# Patient Record
Sex: Female | Born: 1955 | Race: Black or African American | Hispanic: No | Smoking: Never smoker
Health system: Southern US, Community
[De-identification: ages and names within clinical notes are randomized; demographics above are authoritative.]

## PROBLEM LIST (undated history)

## (undated) DIAGNOSIS — E785 Hyperlipidemia, unspecified: Secondary | ICD-10-CM

## (undated) DIAGNOSIS — Z8719 Personal history of other diseases of the digestive system: Secondary | ICD-10-CM

## (undated) DIAGNOSIS — D649 Anemia, unspecified: Secondary | ICD-10-CM

## (undated) DIAGNOSIS — R0600 Dyspnea, unspecified: Secondary | ICD-10-CM

## (undated) DIAGNOSIS — Z9889 Other specified postprocedural states: Secondary | ICD-10-CM

## (undated) DIAGNOSIS — I10 Essential (primary) hypertension: Secondary | ICD-10-CM

## (undated) DIAGNOSIS — Z9289 Personal history of other medical treatment: Secondary | ICD-10-CM

## (undated) DIAGNOSIS — R7303 Prediabetes: Secondary | ICD-10-CM

## (undated) DIAGNOSIS — Z8673 Personal history of transient ischemic attack (TIA), and cerebral infarction without residual deficits: Secondary | ICD-10-CM

## (undated) HISTORY — PX: DIAGNOSTIC LAPAROSCOPY: SUR761

## (undated) HISTORY — PX: LAPAROSCOPIC OOPHERECTOMY: SHX6507

## (undated) HISTORY — PX: ABDOMINAL HYSTERECTOMY: SHX81

## (undated) HISTORY — PX: ABDOMINAL SURGERY: SHX537

## (undated) NOTE — *Deleted (*Deleted)
Progress Note  Patient Name: Molly Flores Date of Encounter: 10/14/2020  [facility] Cardiologist: Armanda Magic, MD ***  Subjective   ***  Inpatient Medications    Scheduled Meds: . aspirin  81 mg Oral Daily  . atorvastatin  40 mg Oral Daily  . enoxaparin (LOVENOX) injection  60 mg Subcutaneous QHS  . metoprolol tartrate  50 mg Oral BID   Continuous Infusions:  PRN Meds: acetaminophen, ondansetron (ZOFRAN) IV   Vital Signs    Vitals:   10/14/20 1232 10/14/20 1303 10/14/20 1421 10/14/20 1653  BP:  (!) 171/85  (!) 158/84  Pulse:   66 78  Resp:   (!) 22 20  Temp: 98.2 F (36.8 C)   98.3 F (36.8 C)  TempSrc: Oral Oral  Oral  SpO2:   100% 99%  Weight:      Height:        Intake/Output Summary (Last 24 hours) at 10/14/2020 2013 Last data filed at 10/14/2020 0033 Gross per 24 hour  Intake 509.25 ml  Output -  Net 509.25 ml   Last 3 Weights 10/14/2020 04/12/2020 03/02/2020  Weight (lbs) 278 lb 10.6 oz 278 lb 9.6 oz 260 lb  Weight (kg) 126.4 kg 126.372 kg 117.935 kg      Telemetry    *** - Personally Reviewed  ECG    *** - Personally Reviewed  Physical Exam  *** GEN: No acute distress.   Neck: No JVD Cardiac: RRR, no murmurs, rubs, or gallops.  Respiratory: Clear to auscultation bilaterally. GI: Soft, nontender, non-distended  MS: No edema; No deformity. Neuro:  Nonfocal  Psych: Normal affect   Labs    High Sensitivity Troponin:   Recent Labs  Lab 10/13/20 2144 10/13/20 2350  TROPONINIHS 4 4      Chemistry Recent Labs  Lab 10/13/20 2144  NA 138  K 4.2  CL 103  CO2 25  GLUCOSE 146*  BUN 8  CREATININE 0.87  CALCIUM 9.5  PROT 7.4  ALBUMIN 3.6  AST 19  ALT 13  ALKPHOS 61  BILITOT 0.4  GFRNONAA >60  ANIONGAP 10     Hematology Recent Labs  Lab 10/13/20 2144  WBC 6.6  RBC 4.55  HGB 11.1*  HCT 38.0  MCV 83.5  MCH 24.4*  MCHC 29.2*  RDW 16.3*  PLT 281    BNP Recent Labs  Lab 10/13/20 2144  BNP 36.4      DDimer No results for input(s): DDIMER in the last 168 hours.   Radiology    CT ANGIO CHEST PE W OR WO CONTRAST  Result Date: 10/14/2020 CLINICAL DATA:  Syncope, fall, dyspnea, chest pressure EXAM: CT ANGIOGRAPHY CHEST WITH CONTRAST TECHNIQUE: Multidetector CT imaging of the chest was performed using the standard protocol during bolus administration of intravenous contrast. Multiplanar CT image reconstructions and MIPs were obtained to evaluate the vascular anatomy. CONTRAST:  80mL OMNIPAQUE IOHEXOL 350 MG/ML SOLN COMPARISON:  03/16/2020 FINDINGS: Cardiovascular: Satisfactory opacification of the pulmonary arteries to the segmental level. No evidence of pulmonary embolism. Normal heart size. No pericardial effusion. The thoracic aorta is unremarkable save for bovine arch anatomy. Mediastinum/Nodes: No enlarged mediastinal, hilar, or axillary lymph nodes. Thyroid gland, trachea, and esophagus demonstrate no significant findings. Lungs/Pleura: Evaluation of the pulmonary parenchyma is slightly limited by motion artifact. 7 mm noncalcified pulmonary nodule within the lingula may have enlarged slightly in the interval since prior examination, this may be in part artifactual and related to motion artifact. Intrapulmonary lymph  node along the minor fissure is not well visualized. Previously noted ground-glass pulmonary infiltrate is not well appreciated on the current examination. No new focal pulmonary infiltrates or nodules. No pneumothorax or pleural effusion. Central airways are widely patent. Upper Abdomen: No acute abnormality. Musculoskeletal: No acute bone abnormality Review of the MIP images confirms the above findings. IMPRESSION: No pulmonary embolism. No radiographic explanation for the patient's reported symptoms. Previously noted pulmonary nodules are not optimally assessed on this examination due to motion artifact. Follow-up evaluation, as outlined on prior report in 12 months from that examination  (approximately 03/2021) is recommended for definitive evaluation. Aortic Atherosclerosis (ICD10-I70.0). Electronically Signed   By: Helyn Numbers MD   On: 10/14/2020 00:25   DG Chest Port 1 View  Result Date: 10/13/2020 CLINICAL DATA:  89 year old female with shortness of breath. EXAM: PORTABLE CHEST 1 VIEW COMPARISON:  Chest CT dated 03/16/2020. FINDINGS: There is mild cardiomegaly with mild vascular congestion. No focal consolidation, pleural effusion or pneumothorax. No acute osseous pathology. IMPRESSION: Mild cardiomegaly with mild vascular congestion. No focal consolidation. Electronically Signed   By: Elgie Collard M.D.   On: 10/13/2020 22:13   ECHOCARDIOGRAM COMPLETE  Result Date: 10/14/2020    ECHOCARDIOGRAM REPORT   Patient Name:   Molly Flores Date of Exam: 10/14/2020 Medical Rec #:  [id number]    Height:       65.0 in Accession #:    [id number]   Weight:       278.7 lb Date of Birth:  14-May-1956    BSA:          2.278 m Patient Age:    64 years     BP:           164/77 mmHg Patient Gender: F            HR:           76 bpm. Exam Location:  Inpatient Procedure: 2D Echo, Cardiac Doppler and Color Doppler Indications:    R55 Syncope; R07.9* Chest pain, unspecified  History:        Patient has no prior history of Echocardiogram examinations.                 Risk Factors:Hypertension and Dyslipidemia.  Sonographer:    Elmarie Shiley Dance Referring Phys: [id number] JARED M GARDNER IMPRESSIONS  1. Left ventricular ejection fraction, by estimation, is 60 to 65%. The left ventricle has normal function. The left ventricle has no regional wall motion abnormalities. Left ventricular diastolic parameters were normal.  2. Right ventricular systolic function is normal. The right ventricular size is normal.  3. The mitral valve is normal in structure. No evidence of mitral valve regurgitation. No evidence of mitral stenosis.  4. The aortic valve is tricuspid. Aortic valve regurgitation is not visualized. No aortic  stenosis is present.  5. The inferior vena cava is normal in size with greater than 50% respiratory variability, suggesting right atrial pressure of 3 mmHg. FINDINGS  Left Ventricle: Left ventricular ejection fraction, by estimation, is 60 to 65%. The left ventricle has normal function. The left ventricle has no regional wall motion abnormalities. The left ventricular internal cavity size was normal in size. There is  no left ventricular hypertrophy. Left ventricular diastolic parameters were normal. Right Ventricle: The right ventricular size is normal.Right ventricular systolic function is normal. Left Atrium: Left atrial size was normal in size. Right Atrium: Right atrial size was normal in size. Pericardium: There is no evidence of pericardial  effusion. Mitral Valve: The mitral valve is normal in structure. No evidence of mitral valve regurgitation. No evidence of mitral valve stenosis. Tricuspid Valve: The tricuspid valve is normal in structure. Tricuspid valve regurgitation is trivial. No evidence of tricuspid stenosis. Aortic Valve: The aortic valve is tricuspid. Aortic valve regurgitation is not visualized. No aortic stenosis is present. Pulmonic Valve: The pulmonic valve was normal in structure. Pulmonic valve regurgitation is not visualized. No evidence of pulmonic stenosis. Aorta: The aortic root is normal in size and structure. Venous: The inferior vena cava is normal in size with greater than 50% respiratory variability, suggesting right atrial pressure of 3 mmHg.  LEFT VENTRICLE PLAX 2D LVIDd:         3.96 cm Diastology LVIDs:         3.05 cm LV e' medial:    8.05 cm/s LV PW:         1.03 cm LV E/e' medial:  11.8 LV IVS:        0.86 cm LV e' lateral:   10.80 cm/s                        LV E/e' lateral: 8.8  RIGHT VENTRICLE             IVC RV Basal diam:  3.16 cm     IVC diam: 1.77 cm RV Mid diam:    2.00 cm RV S prime:     12.20 cm/s TAPSE (M-mode): 2.0 cm LEFT ATRIUM             Index       RIGHT  ATRIUM           Index LA diam:        4.00 cm 1.76 cm/m  RA Area:     17.80 cm LA Vol (A2C):   48.6 ml 21.34 ml/m RA Volume:   54.00 ml  23.71 ml/m LA Vol (A4C):   57.0 ml 25.03 ml/m LA Biplane Vol: 53.4 ml 23.45 ml/m  AORTIC VALVE LVOT Vmax:   114.00 cm/s LVOT Vmean:  68.100 cm/s LVOT VTI:    0.244 m  AORTA Ao Asc diam: 3.00 cm MITRAL VALVE MV Area (PHT): 2.91 cm    SHUNTS MV Decel Time: 261 msec    Systemic VTI: 0.24 m MV E velocity: 94.60 cm/s MV A velocity: 80.50 cm/s MV E/A ratio:  1.18 Olga Millers MD Electronically signed by Olga Millers MD Signature Date/Time: 10/14/2020/10:43:36 AM    Final     Cardiac Studies   TTE 10/14/20: 1. Left ventricular ejection fraction, by estimation, is 60 to 65%. The  left ventricle has normal function. The left ventricle has no regional  wall motion abnormalities. Left ventricular diastolic parameters were  normal.  2. Right ventricular systolic function is normal. The right ventricular  size is normal.  3. The mitral valve is normal in structure. No evidence of mitral valve  regurgitation. No evidence of mitral stenosis.  4. The aortic valve is tricuspid. Aortic valve regurgitation is not  visualized. No aortic stenosis is present.  5. The inferior vena cava is normal in size with greater than 50%  respiratory variability, suggesting right atrial pressure of 3 mmHg.   Patient Profile     49 y.o. female with PMH  of hypertension, hyperlipidemia, prior stroke, pulmonary nodule and recent evaluation by pulmonology for ongoing shortness of breath since viral syndrome in February 2020 who is being seen today  for the evaluation of chest pain and syncope.  Assessment & Plan    #Syncope:  #Chest Pain:  #HTN  #HLD {Are we signing off today?:[id number]}  For questions or updates, please contact [facility] Please consult [email or web address] for contact info under        Signed, Meriam Sprague, MD  10/14/2020, 8:13 PM

---

## 2020-01-03 ENCOUNTER — Other Ambulatory Visit (HOSPITAL_COMMUNITY): Payer: Self-pay

## 2020-01-03 DIAGNOSIS — Z1231 Encounter for screening mammogram for malignant neoplasm of breast: Secondary | ICD-10-CM

## 2020-01-24 ENCOUNTER — Ambulatory Visit: Payer: Self-pay

## 2020-02-27 ENCOUNTER — Ambulatory Visit: Payer: Self-pay | Attending: Internal Medicine

## 2020-02-27 DIAGNOSIS — Z23 Encounter for immunization: Secondary | ICD-10-CM

## 2020-02-27 NOTE — Progress Notes (Signed)
   Covid-19 Vaccination Clinic  Name:  Meili Kleckley    MRN: [id number] DOB: 1956-06-12  02/27/2020  Ms. Deveny was observed post Covid-19 immunization for 15 minutes without incident. She was provided with Vaccine Information Sheet and instruction to access the V-Safe system.   Ms. Christon was instructed to call 911 with any severe reactions post vaccine: Marland Kitchen Difficulty breathing  . Swelling of face and throat  . A fast heartbeat  . A bad rash all over body  . Dizziness and weakness   Immunizations Administered    Name Date Dose VIS Date Route   Pfizer COVID-19 Vaccine 02/27/2020  8:13 AM 0.3 mL 11/11/2019 Intramuscular   Manufacturer: [employer], [employer]   Lot: [id number]   NDC: [address]-0

## 2020-03-02 ENCOUNTER — Other Ambulatory Visit: Payer: Self-pay

## 2020-03-02 ENCOUNTER — Ambulatory Visit (HOSPITAL_COMMUNITY)
Admission: EM | Admit: 2020-03-02 | Discharge: 2020-03-02 | Disposition: A | Discharge status: Home / Self Care | Payer: 59 | Attending: Family Medicine | Admitting: Family Medicine

## 2020-03-02 ENCOUNTER — Telehealth (HOSPITAL_COMMUNITY): Payer: Self-pay | Admitting: Family Medicine

## 2020-03-02 ENCOUNTER — Encounter (HOSPITAL_COMMUNITY): Payer: Self-pay

## 2020-03-02 DIAGNOSIS — R0600 Dyspnea, unspecified: Secondary | ICD-10-CM | POA: Diagnosis present

## 2020-03-02 DIAGNOSIS — R0609 Other forms of dyspnea: Secondary | ICD-10-CM

## 2020-03-02 HISTORY — DX: Essential (primary) hypertension: I10

## 2020-03-02 HISTORY — DX: Hyperlipidemia, unspecified: E78.5

## 2020-03-02 LAB — CBC
HCT: 36.5 % (ref 36.0–46.0)
Hemoglobin: 10.8 g/dL — ABNORMAL LOW (ref 12.0–15.0)
MCH: 24.6 pg — ABNORMAL LOW (ref 26.0–34.0)
MCHC: 29.6 g/dL — ABNORMAL LOW (ref 30.0–36.0)
MCV: 83.1 fL (ref 80.0–100.0)
Platelets: 269 10*3/uL (ref 150–400)
RBC: 4.39 MIL/uL (ref 3.87–5.11)
RDW: 16 % — ABNORMAL HIGH (ref 11.5–15.5)
WBC: 5.6 10*3/uL (ref 4.0–10.5)
nRBC: 0 % (ref 0.0–0.2)

## 2020-03-02 MED ORDER — PREDNISONE 20 MG PO TABS: 20.0000 mg | ORAL_TABLET | Freq: Two times a day (BID) | ORAL | 0 refills | Status: DC

## 2020-03-02 NOTE — ED Provider Notes (Signed)
[facility]    CSN: [id number] Arrival date & time: 03/02/20  1307      History   Chief Complaint Chief Complaint  Patient presents with  . Shortness of Breath    HPI Molly Flores is a 64 y.o. female.   HPI  Patient is here for dyspnea on exertion.  She states she chronically short of breath but is worse the last couple of days.  She states that has been going on ever since her hospitalization March 2020.  Patient states that she believes that she may have had Covid but was not tested for it.  She also complains of left chest pain and left arm pain with numbness and tingling in the left fingers. Patient's hospitalization of 02/18/2019 is reviewed.  She had an extensive work-up for chest pain, tingling, dyspnea.  She had CT of her chest.  Echocardiogram.  Cardiac stress test.  All of her testing was negative.  She followed up with a primary care doctor.  No clear reason was identified for her symptoms. Patient states she has asthma.  She states that her shortness of breath is improved with nebulizer treatments  she has not had any fever or chills.  No cough.  No change in taste or smell.  No known exposure to illness. She does have cardiac risk factors of hypertension, hyperlipidemia.  No diabetes.  She is obese and sedentary.  No family history of coronary disease.  Never smoker She also has a history of anemia.  Questions whether her anemia could cause shortness of breath.  States she takes iron on a regular basis.  States that when she last had blood work (couple months ago) her hemoglobin was low.  She does recall what the value was Past Medical History:  Diagnosis Date  . Hyperlipidemia   . Hypertension     There are no problems to display for this patient.   Past Surgical History:  Procedure Laterality Date  . ABDOMINAL SURGERY    . LAPAROSCOPIC OOPHERECTOMY Bilateral     OB History   No obstetric history on file.      Home Medications    Prior to  Admission medications   Medication Sig Start Date End Date Taking? Authorizing Provider  albuterol (VENTOLIN HFA) 108 (90 Base) MCG/ACT inhaler Inhale into the lungs. 12/09/18  Yes [provider]  atorvastatin (LIPITOR) 10 MG tablet Take by mouth. 08/30/19  Yes [provider]  carvedilol (COREG) 6.25 MG tablet Take by mouth. 08/30/19  Yes [provider]  Cholecalciferol 25 MCG (1000 UT) tablet Take by mouth. 09/15/16  Yes [provider]  esomeprazole (NEXIUM) 40 MG capsule Take by mouth. 02/24/19 03/02/20 Yes [provider]  aspirin 81 MG EC tablet Take by mouth.    [provider]  ferrous sulfate 325 (65 FE) MG tablet Take by mouth.    [provider]  ibuprofen (ADVIL) 200 MG tablet Take by mouth.    [provider]  loratadine (CLARITIN) 10 MG tablet Take by mouth.    [provider]  Multiple Vitamin (MULTI-VITAMIN) tablet Take by mouth.    [provider]  MULTIPLE VITAMIN PO Take by mouth.    [provider]  Omega-3 Fatty Acids (FISH OIL) 1000 MG CAPS Take by mouth.    [provider]  predniSONE (DELTASONE) 20 MG tablet Take 1 tablet (20 mg total) by mouth 2 (two) times daily with a meal. 03/02/20   Rica Mast  Collie Siad, MD    Family History Family History  Problem Relation Age of Onset  . Diabetes Mother   . Hyperlipidemia Mother   . Hypertension Mother     Social History Social History   Tobacco Use  . Smoking status: Never Smoker  . Smokeless tobacco: Never Used  Substance Use Topics  . Alcohol use: Never  . Drug use: Never     Allergies   Oxycodone   Review of Systems Review of Systems  Constitutional: Positive for fatigue. Negative for chills, diaphoresis and fever.  HENT: Negative for congestion and postnasal drip.   Eyes: Negative for redness and itching.  Respiratory: Positive for shortness of breath and wheezing. Negative for cough.   Cardiovascular:  Positive for chest pain.  Gastrointestinal: Negative for nausea and vomiting.  Neurological: Negative for headaches.     Physical Exam Triage Vital Signs ED Triage Vitals  Enc Vitals Group     BP 03/02/20 1339 (!) 175/98     Pulse Rate 03/02/20 1339 71     Resp 03/02/20 1339 18     Temp 03/02/20 1339 98.3 F (36.8 C)     Temp Source 03/02/20 1339 Oral     SpO2 03/02/20 1339 100 %     Weight 03/02/20 1340 260 lb (117.9 kg)     Height 03/02/20 1340 5\' 5"  (1.651 m)     Head Circumference --      Peak Flow --      Pain Score 03/02/20 1339 6     Pain Loc --      Pain Edu? --      Excl. in [place]? --    No data found.  Updated Vital Signs BP (!) 175/98   Pulse 71   Temp 98.3 F (36.8 C) (Oral)   Resp 18   Ht 5\' 5"  (1.651 m)   Wt 117.9 kg   SpO2 100%   BMI 43.27 kg/m      Physical Exam Constitutional:      General: She is not in acute distress.    Appearance: She is well-developed. She is obese.  HENT:     Head: Normocephalic and atraumatic.     Mouth/Throat:     Mouth: Mucous membranes are moist.     Comments: Mask is in place  Eyes:     Conjunctiva/sclera: Conjunctivae normal.     Pupils: Pupils are equal, round, and reactive to light.  Cardiovascular:     Rate and Rhythm: Normal rate and regular rhythm.     Heart sounds: Murmur present.     Comments: Soft systolic right upper sternal border  Pulmonary:     Effort: Pulmonary effort is normal. No respiratory distress.     Breath sounds: Normal breath sounds.     Comments: Lungs clear Abdominal:     General: There is no distension.     Palpations: Abdomen is soft.  Musculoskeletal:        General: Normal range of motion.     Cervical back: Normal range of motion and neck supple.     Right lower leg: No edema.     Left lower leg: No edema.  Skin:    General: Skin is warm and dry.  Neurological:     Mental Status: She is alert.  Psychiatric:        Mood and Affect: Mood normal.        Behavior: Behavior  normal.      UC Treatments /  Results  Labs (all labs ordered are listed, but only abnormal results are displayed) Labs Reviewed  CBC - Abnormal; Notable for the following components:      Result Value   Hemoglobin 10.8 (*)    MCH 24.6 (*)    MCHC 29.6 (*)    RDW 16.0 (*)    All other components within normal limits    EKG   Radiology No results found.  Procedures Procedures (including critical care time)  Medications Ordered in UC Medications - No data to display  Initial Impression / Assessment and Plan / UC Course  I have reviewed the triage vital signs and the nursing notes.  Pertinent labs & imaging results that were available during my care of the patient were reviewed by me and considered in my medical decision making (see chart for details).     Patient has asthma and has been wheezing by her report  This could explain the increased DOE The DOE for a year is not as easy to explain May be poor conditioning Heart testing all clear Never had PFT per patient Needs follow with PCP  Final Clinical Impressions(s) / UC Diagnoses   Final diagnoses:  Dyspnea on exertion     Discharge Instructions     Take the prednisone 2 x a day Continue the iron supplement You can get your test results on My Chart I will call later with the hemoglobin ( anemia) test See your primary care in follow up   ED Prescriptions    Medication Sig Dispense Auth. Provider   predniSONE (DELTASONE) 20 MG tablet Take 1 tablet (20 mg total) by mouth 2 (two) times daily with a meal. 10 tablet Eustace Moore, MD     PDMP not reviewed this encounter.   Eustace Moore, MD 03/02/20 2025

## 2020-03-02 NOTE — Discharge Instructions (Addendum)
Take the prednisone 2 x a day Continue the iron supplement You can get your test results on My Chart I will call later with the hemoglobin ( anemia) test See your primary care in follow up

## 2020-03-02 NOTE — Telephone Encounter (Signed)
Discussed CBC with patient Anemia is mild

## 2020-03-02 NOTE — ED Triage Notes (Addendum)
Pt c/o dyspnea w/exertion and at rest got worse the last couple of days, but this has been going since Feb or March of 2020. Pt states she believes she had COVID and went to hosp, but they wasn't testing for COVID at the time and she thinks she had COVID, because she was having all of the symptoms she says. Pt breath sound are clear. Pt has non labored breathing. Skin color WNL.  Pt also states has 7/10 non radiating mid to left chest tightness. Pt states last night she had numbness and tingling in left arm and fingers with the chest pain.

## 2020-03-14 ENCOUNTER — Other Ambulatory Visit: Payer: Self-pay | Admitting: Physician Assistant

## 2020-03-14 DIAGNOSIS — R911 Solitary pulmonary nodule: Secondary | ICD-10-CM

## 2020-03-16 ENCOUNTER — Ambulatory Visit
Admission: RE | Admit: 2020-03-16 | Discharge: 2020-03-16 | Disposition: A | Discharge status: Home / Self Care | Payer: 59 | Source: Ambulatory Visit | Attending: Physician Assistant | Admitting: Physician Assistant

## 2020-03-16 DIAGNOSIS — R911 Solitary pulmonary nodule: Secondary | ICD-10-CM

## 2020-03-21 ENCOUNTER — Ambulatory Visit: Payer: 59 | Attending: Internal Medicine

## 2020-03-21 DIAGNOSIS — Z23 Encounter for immunization: Secondary | ICD-10-CM

## 2020-03-21 NOTE — Progress Notes (Signed)
   Covid-19 Vaccination Clinic  Name:  Molly Flores    MRN: [id number] DOB: 1956-11-03  03/21/2020  [name] was observed post Covid-19 immunization for 15 minutes without incident. She was provided with Vaccine Information Sheet and instruction to access the V-Safe system.   [name] was instructed to call 911 with any severe reactions post vaccine: Marland Kitchen Difficulty breathing  . Swelling of face and throat  . A fast heartbeat  . A bad rash all over body  . Dizziness and weakness   Immunizations Administered    Name Date Dose VIS Date Route   Pfizer COVID-19 Vaccine 03/21/2020  8:51 AM 0.3 mL 01/25/2019 Intramuscular   Manufacturer: [employer], [employer]   Lot: [id number]   NDC: [address]-8

## 2020-04-12 ENCOUNTER — Encounter: Payer: Self-pay | Admitting: Pulmonary Disease

## 2020-04-12 ENCOUNTER — Ambulatory Visit (INDEPENDENT_AMBULATORY_CARE_PROVIDER_SITE_OTHER): Payer: 59 | Admitting: Pulmonary Disease

## 2020-04-12 ENCOUNTER — Other Ambulatory Visit: Payer: Self-pay

## 2020-04-12 ENCOUNTER — Other Ambulatory Visit (INDEPENDENT_AMBULATORY_CARE_PROVIDER_SITE_OTHER): Payer: 59

## 2020-04-12 VITALS — BP 136/70 | HR 72 | Temp 97.9°F | Ht 65.0 in | Wt 278.6 lb

## 2020-04-12 DIAGNOSIS — R911 Solitary pulmonary nodule: Secondary | ICD-10-CM

## 2020-04-12 DIAGNOSIS — R0689 Other abnormalities of breathing: Secondary | ICD-10-CM

## 2020-04-12 DIAGNOSIS — R0600 Dyspnea, unspecified: Secondary | ICD-10-CM | POA: Diagnosis not present

## 2020-04-12 LAB — CBC WITH DIFFERENTIAL/PLATELET
Basophils Absolute: 0 10*3/uL (ref 0.0–0.1)
Basophils Relative: 0.8 % (ref 0.0–3.0)
Eosinophils Absolute: 0 10*3/uL (ref 0.0–0.7)
Eosinophils Relative: 0.9 % (ref 0.0–5.0)
HCT: 35.1 % — ABNORMAL LOW (ref 36.0–46.0)
Hemoglobin: 11.1 g/dL — ABNORMAL LOW (ref 12.0–15.0)
Lymphocytes Relative: 33.4 % (ref 12.0–46.0)
Lymphs Abs: 1.6 10*3/uL (ref 0.7–4.0)
MCHC: 31.7 g/dL (ref 30.0–36.0)
MCV: 79.4 fl (ref 78.0–100.0)
Monocytes Absolute: 0.4 10*3/uL (ref 0.1–1.0)
Monocytes Relative: 8.4 % (ref 3.0–12.0)
Neutro Abs: 2.7 10*3/uL (ref 1.4–7.7)
Neutrophils Relative %: 56.5 % (ref 43.0–77.0)
Platelets: 278 10*3/uL (ref 150.0–400.0)
RBC: 4.42 Mil/uL (ref 3.87–5.11)
RDW: 16.1 % — ABNORMAL HIGH (ref 11.5–15.5)
WBC: 4.8 10*3/uL (ref 4.0–10.5)

## 2020-04-12 MED ORDER — BUDESONIDE-FORMOTEROL FUMARATE 160-4.5 MCG/ACT IN AERO: 2.0000 | INHALATION_SPRAY | Freq: Two times a day (BID) | RESPIRATORY_TRACT | 3 refills | Status: DC

## 2020-04-12 NOTE — Patient Instructions (Signed)
We will check CBC differential, IgE today Schedule pulmonary function test Trial Symbicort 160 inhaler  For your lung nodule please see if you can obtain the disc from the old CT scan. At this point the lung nodules are really small to be evaluated by PET scan or bronchoscopy. We will continue to follow this closely Follow-up CT without contrast in 6 months.

## 2020-04-12 NOTE — Addendum Note (Signed)
Addended by: Maurene Capes on: 04/12/2020 09:37 AM   Modules accepted: Orders

## 2020-04-12 NOTE — Progress Notes (Signed)
Molly Flores    [id number]    03-Oct-1956  Primary Care Physician:Quinn, Bertha Stakes, PA  Referring Physician: Ephriam Jenkins, [address] [place],  [place] [address]  Chief complaint:  Consult for wheezing, lung nodule  HPI: 64 year old with history of hypertension, hyperlipidemia, stroke, allergies She had a viral-like illness in February 2020 which she thinks is Covid.  Got tested at that time She had multiple emergency room visits while living at [facility] and a CTA done in March 2020 showed groundglass nodule in the right lung.  Since that episode she continues to have dyspnea at rest and with exertion associated with wheezing.  She just has Ventolin which she uses as needed several times a week. Seen in urgent care last month for dyspnea and wheezing and given 10 days of prednisone.  Pets: No pets Occupation: Works as a Surveyor, minerals and a daycare Exposures: No known exposures.  No mold, hot tub, Jacuzzi Smoking history: Never smoker.  Had been exposed to secondhand smoke in the past Travel history: Briefly lived in [place].  No significant recent travel Relevant family history: No significant family history of lung disease   Outpatient Encounter Medications as of 04/12/2020  Medication Sig  . albuterol (VENTOLIN HFA) 108 (90 Base) MCG/ACT inhaler Inhale into the lungs.  Marland Kitchen aspirin 81 MG EC tablet Take by mouth.  Marland Kitchen atorvastatin (LIPITOR) 10 MG tablet Take by mouth.  . carvedilol (COREG) 6.25 MG tablet Take by mouth.  . ferrous sulfate 325 (65 FE) MG tablet Take by mouth.  Marland Kitchen ibuprofen (ADVIL) 200 MG tablet Take by mouth.  . loratadine (CLARITIN) 10 MG tablet Take by mouth.  . Multiple Vitamin (MULTI-VITAMIN) tablet Take by mouth.  . Cholecalciferol 25 MCG (1000 UT) tablet Take by mouth.  . Omega-3 Fatty Acids (FISH OIL) 1000 MG CAPS Take by mouth.  . [DISCONTINUED] esomeprazole (NEXIUM) 40 MG capsule Take by mouth.  . [DISCONTINUED] MULTIPLE VITAMIN PO Take by mouth.   . [DISCONTINUED] predniSONE (DELTASONE) 20 MG tablet Take 1 tablet (20 mg total) by mouth 2 (two) times daily with a meal.   No facility-administered encounter medications on file as of 04/12/2020.    Allergies as of 04/12/2020 - Review Complete 04/12/2020  Allergen Reaction Noted  . Oxycodone  03/02/2020    Past Medical History:  Diagnosis Date  . Hyperlipidemia   . Hypertension     Past Surgical History:  Procedure Laterality Date  . ABDOMINAL SURGERY    . LAPAROSCOPIC OOPHERECTOMY Bilateral     Family History  Problem Relation Age of Onset  . Diabetes Mother   . Hyperlipidemia Mother   . Hypertension Mother     Social History   Socioeconomic History  . Marital status: Single    Spouse name: Not on file  . Number of children: Not on file  . Years of education: Not on file  . Highest education level: Not on file  Occupational History  . Not on file  Tobacco Use  . Smoking status: Never Smoker  . Smokeless tobacco: Never Used  Substance and Sexual Activity  . Alcohol use: Never  . Drug use: Never  . Sexual activity: Not on file  Other Topics Concern  . Not on file  Social History Narrative  . Not on file   Social Determinants of Health   Financial Resource Strain:   . Difficulty of Paying Living Expenses:   Food Insecurity:   .  Worried About Programme researcher, broadcasting/film/video in the Last Year:   . Barista in the Last Year:   Transportation Needs:   . Freight forwarder (Medical):   Marland Kitchen Lack of Transportation (Non-Medical):   Physical Activity:   . Days of Exercise per Week:   . Minutes of Exercise per Session:   Stress:   . Feeling of Stress :   Social Connections:   . Frequency of Communication with Friends and Family:   . Frequency of Social Gatherings with Friends and Family:   . Attends Religious Services:   . Active Member of Clubs or Organizations:   . Attends Banker Meetings:   Marland Kitchen Marital Status:   Intimate Partner Violence:    . Fear of Current or Ex-Partner:   . Emotionally Abused:   Marland Kitchen Physically Abused:   . Sexually Abused:     Review of systems: Review of Systems  Constitutional: Negative for fever and chills.  HENT: Negative.   Eyes: Negative for blurred vision.  Respiratory: as per HPI  Cardiovascular: Negative for chest pain and palpitations.  Gastrointestinal: Negative for vomiting, diarrhea, blood per rectum. Genitourinary: Negative for dysuria, urgency, frequency and hematuria.  Musculoskeletal: Negative for myalgias, back pain and joint pain.  Skin: Negative for itching and rash.  Neurological: Negative for dizziness, tremors, focal weakness, seizures and loss of consciousness.  Endo/Heme/Allergies: Negative for environmental allergies.  Psychiatric/Behavioral: Negative for depression, suicidal ideas and hallucinations.  All other systems reviewed and are negative.  Physical Exam: Blood pressure 136/70, pulse 72, temperature 97.9 F (36.6 C), temperature source Temporal, height 5\' 5"  (1.651 m), weight 278 lb 9.6 oz (126.4 kg), SpO2 99 %. Gen:      No acute distress HEENT:  EOMI, sclera anicteric Neck:     No masses; no thyromegaly Lungs:    Clear to auscultation bilaterally; normal respiratory effort CV:         Regular rate and rhythm; no murmurs Abd:      + bowel sounds; soft, non-tender; no palpable masses, no distension Ext:    No edema; adequate peripheral perfusion Skin:      Warm and dry; no rash Neuro: alert and oriented x 3 Psych: normal mood and affect  Data Reviewed: Imaging: CTA [facility] 12/09/2018-normal study  CTA [facility] 02/08/2019- 7 mm, noncalcified solitary pulmonary nodule in the anterior segment of the right upper lobe, unchanged in size and appearance when compared to the prior study.  CT chest 03/16/2020-6 mm nodule along the minor fissure, faint groundglass 10 mm nodule in the anterior segment of right lower lobe, 5 mm lingular nodule. I have reviewed the images  personally.  PFTs: Pending  Labs: CBC 03/02/2020-WBC 5.6, hemoglobin 10.8  Assessment:  Dyspnea, wheezing May have reactive airway disease, asthma We will evaluate with CBC differential, IgE Schedule pulmonary function test Trial her on Symbicort  Lung nodule I do not have the images from [facility] to review and will try to obtain the disc shipped to 05/02/2020.  By report she had right upper lobe nodule last year.  Her latest scan in March this year showed groundglass opacity in the right lower lobe with new 6 mm nodule in the fissure and 5 mm nodule in the lingula.  This does not appear to be growth of lung nodule as the lobes are different and the groundglass 10 mm nodule appears inflammatory in nature Follow this closely with a CT scan without contrast in 6 months.  Plan/Recommendations: CBC, IgE, PFTs Symbicort Follow-up CT in 6 months Obtain CT disc from [facility]  Chilton Greathouse MD [facility] 04/12/2020, 8:58 AM  CC: Adrienne Mocha, PA

## 2020-04-13 LAB — IGE: IgE (Immunoglobulin E), Serum: 79 kU/L (ref <=114)

## 2020-04-23 ENCOUNTER — Telehealth: Payer: Self-pay | Admitting: Pulmonary Disease

## 2020-04-23 NOTE — Telephone Encounter (Signed)
Called and spoke with pt. Pt wanted to know if we had received her records from [place], [place] yet as she stated she filled out a release of records form at her last visit on 5/13 with Dr. Isaiah Serge.  Pt also wanted to know the results of her tests she had done at that visit. Dr. Isaiah Serge, please advise on all this for pt. Thanks!

## 2020-04-26 NOTE — Telephone Encounter (Signed)
We have not received the CT disc from [place] yet.  Can you make another attempt to get the disc?

## 2020-04-27 NOTE — Telephone Encounter (Signed)
Attempted to call pt but unable to reach. Left message for her to return call. 

## 2020-05-01 NOTE — Telephone Encounter (Signed)
Called [facility] records to check on the status of the release sent on 04/12/20. The rep I spoke with stated that the CD was mailed on 04/13/20 from their [place] location. It was sent via [employer], therefore it does not a tracking number. She stated that sometimes it will take 2-4 weeks.   I checked both the inbox up front and Dr. Shirlee More inbox in [facility], did not see the CD.   Will continue to look out for CD.

## 2020-05-07 ENCOUNTER — Telehealth: Payer: Self-pay | Admitting: Pulmonary Disease

## 2020-05-07 NOTE — Telephone Encounter (Signed)
Called and spoke with Patient.  Patient requested lab results from 04/12/20.  Message routed to Dr Isaiah Serge to advise on lab results for Patient.

## 2020-05-08 NOTE — Telephone Encounter (Signed)
Waiting on result note from Dr. Isaiah Serge.

## 2020-05-14 ENCOUNTER — Telehealth: Payer: Self-pay | Admitting: Pulmonary Disease

## 2020-05-14 NOTE — Telephone Encounter (Signed)
Patient requesting a medication alternative. Please advise.  Patient calling back to ask about getting a replacement medication for Symbicort. Symbicort is $400 an patient cannot afford it. Please advise.  [phone]

## 2020-05-14 NOTE — Telephone Encounter (Signed)
Called and spoke with patient. She was made aware of her results. I did advise her that it appears we have not received the CT disk from [facility] and I would follow up on this as well.   She also had a question about her Symbicort. She was prescribed Symbicort 160 during her last OV but was advised it was over $400. She wanted to know if there was a generic. Advised her there is a generic and I would call [facility] to see if they can change it to the generic form. She verbalized understanding.   Spoke with the pharmacist who stated that the RX for 04/12/20 was processed as generic. She attempted to process the brand, it is not covered by insurance.   Covered medications include: Advair, AirDuo, Bevespi, Dulera and Pulmicort.   Dr. Isaiah Serge, please advise. Thanks!

## 2020-05-14 NOTE — Telephone Encounter (Signed)
Left message for patient to call back  

## 2020-05-14 NOTE — Telephone Encounter (Signed)
Pt called back about this, please return call.  

## 2020-05-14 NOTE — Telephone Encounter (Signed)
Labs are stable.

## 2020-05-15 MED ORDER — MOMETASONE FURO-FORMOTEROL FUM 200-5 MCG/ACT IN AERO: 2.0000 | INHALATION_SPRAY | Freq: Two times a day (BID) | RESPIRATORY_TRACT | 5 refills | Status: DC

## 2020-05-15 NOTE — Telephone Encounter (Signed)
Ask her to check with her insurance company or pharmacy about preferred inhalers

## 2020-05-15 NOTE — Telephone Encounter (Signed)
Prescribe Dulera 200

## 2020-05-15 NOTE — Telephone Encounter (Signed)
Rx has been sent in per Dr. Mannam. 

## 2020-05-15 NOTE — Telephone Encounter (Signed)
Spoke with the pt and notified of response per Dr Isaiah Serge  She will call her insurance company and get back to [place] with list of preferred inhalers  Will await her response

## 2020-05-21 ENCOUNTER — Other Ambulatory Visit (HOSPITAL_COMMUNITY): Payer: 59

## 2020-05-25 ENCOUNTER — Ambulatory Visit (INDEPENDENT_AMBULATORY_CARE_PROVIDER_SITE_OTHER): Payer: 59 | Admitting: Pulmonary Disease

## 2020-05-25 ENCOUNTER — Other Ambulatory Visit: Payer: Self-pay

## 2020-05-25 DIAGNOSIS — R911 Solitary pulmonary nodule: Secondary | ICD-10-CM

## 2020-05-25 LAB — PULMONARY FUNCTION TEST
DL/VA % pred: 166 %
DL/VA: 6.87 ml/min/mmHg/L
DLCO cor % pred: 106 %
DLCO cor: 22.83 ml/min/mmHg
DLCO unc % pred: 98 %
DLCO unc: 21.04 ml/min/mmHg
FEF 25-75 Post: 2.79 L/sec
FEF 25-75 Pre: 1.83 L/sec
FEF2575-%Change-Post: 52 %
FEF2575-%Pred-Post: 132 %
FEF2575-%Pred-Pre: 87 %
FEV1-%Change-Post: 9 %
FEV1-%Pred-Post: 79 %
FEV1-%Pred-Pre: 72 %
FEV1-Post: 1.75 L
FEV1-Pre: 1.59 L
FEV1FVC-%Change-Post: 6 %
FEV1FVC-%Pred-Pre: 108 %
FEV6-%Change-Post: 3 %
FEV6-%Pred-Post: 71 %
FEV6-%Pred-Pre: 68 %
FEV6-Post: 1.93 L
FEV6-Pre: 1.87 L
FEV6FVC-%Change-Post: 0 %
FEV6FVC-%Pred-Post: 103 %
FEV6FVC-%Pred-Pre: 103 %
FVC-%Change-Post: 3 %
FVC-%Pred-Post: 68 %
FVC-%Pred-Pre: 66 %
FVC-Post: 1.93 L
FVC-Pre: 1.87 L
Post FEV1/FVC ratio: 91 %
Post FEV6/FVC ratio: 100 %
Pre FEV1/FVC ratio: 85 %
Pre FEV6/FVC Ratio: 100 %
RV % pred: 86 %
RV: 1.86 L
TLC % pred: 74 %
TLC: 4.01 L

## 2020-05-25 NOTE — Progress Notes (Signed)
Full PFT preformed today  

## 2020-06-07 ENCOUNTER — Telehealth: Payer: Self-pay | Admitting: Pulmonary Disease

## 2020-06-07 NOTE — Telephone Encounter (Signed)
Patient calling for results of PFT. Patient advised that staff will call her back as soon as Dr. Isaiah Serge reviews the test. Patient understands he is out of the office until 06/10/2020.

## 2020-06-12 ENCOUNTER — Telehealth: Payer: Self-pay | Admitting: Pulmonary Disease

## 2020-06-12 NOTE — Telephone Encounter (Signed)
I tried to call the patient but got voicemail Please let her know that PFTs show very minimal reduction in lung volumes that is likely nonsignificant We will discuss in detail at time of clinic visit.

## 2020-06-12 NOTE — Telephone Encounter (Signed)
Patient contacted with results of PFT.

## 2020-06-12 NOTE — Telephone Encounter (Signed)
Patient contacted with results of PFT. 

## 2020-06-12 NOTE — Telephone Encounter (Signed)
Patient called with results of PFT, she verbalized understanding of results.

## 2020-07-12 ENCOUNTER — Other Ambulatory Visit: Payer: 59

## 2020-07-12 ENCOUNTER — Other Ambulatory Visit: Payer: Self-pay | Admitting: Radiology

## 2020-07-12 DIAGNOSIS — Z20822 Contact with and (suspected) exposure to covid-19: Secondary | ICD-10-CM

## 2020-07-14 LAB — SARS-COV-2, NAA 2 DAY TAT

## 2020-07-14 LAB — NOVEL CORONAVIRUS, NAA: SARS-CoV-2, NAA: NOT DETECTED

## 2020-10-11 ENCOUNTER — Telehealth: Payer: Self-pay | Admitting: Pulmonary Disease

## 2020-10-11 NOTE — Telephone Encounter (Signed)
Yes. Ok to do televisit

## 2020-10-11 NOTE — Telephone Encounter (Signed)
Called and spoke with patient, she stated that she was originally told that her appointment was on 11/22 at 3:30 pm and she gave that information to her boss at work.  She was then told it was at 8:30 am.  She is taking off work to do a scan and that time was changed as well.  She is asking if she can do a televisit for that appointment.  I advised her I would let Dr. Isaiah Serge know and when we heard back from him we would call her back and let her know.  She verbalized understanding.  Dr. Isaiah Serge, Please advise if it is ok to change her appointment to a televisit for 10/22/20.  Thank you.

## 2020-10-12 NOTE — Telephone Encounter (Signed)
Patient is aware of below message.  Appointment note has been updated to reflect phone visit.  Nothing further needed.

## 2020-10-13 ENCOUNTER — Observation Stay (HOSPITAL_COMMUNITY)
Admission: EM | Admit: 2020-10-13 | Discharge: 2020-10-15 | Disposition: A | Discharge status: Home / Self Care | Payer: 59 | Attending: Internal Medicine | Admitting: Internal Medicine

## 2020-10-13 ENCOUNTER — Other Ambulatory Visit: Payer: Self-pay

## 2020-10-13 ENCOUNTER — Emergency Department (HOSPITAL_COMMUNITY): Payer: 59

## 2020-10-13 DIAGNOSIS — R55 Syncope and collapse: Principal | ICD-10-CM | POA: Insufficient documentation

## 2020-10-13 DIAGNOSIS — R918 Other nonspecific abnormal finding of lung field: Secondary | ICD-10-CM | POA: Diagnosis present

## 2020-10-13 DIAGNOSIS — R079 Chest pain, unspecified: Secondary | ICD-10-CM | POA: Diagnosis not present

## 2020-10-13 DIAGNOSIS — R0600 Dyspnea, unspecified: Secondary | ICD-10-CM | POA: Diagnosis present

## 2020-10-13 DIAGNOSIS — R0609 Other forms of dyspnea: Secondary | ICD-10-CM | POA: Diagnosis present

## 2020-10-13 DIAGNOSIS — Z79899 Other long term (current) drug therapy: Secondary | ICD-10-CM | POA: Diagnosis not present

## 2020-10-13 DIAGNOSIS — I10 Essential (primary) hypertension: Secondary | ICD-10-CM | POA: Insufficient documentation

## 2020-10-13 DIAGNOSIS — R0602 Shortness of breath: Secondary | ICD-10-CM | POA: Diagnosis not present

## 2020-10-13 DIAGNOSIS — Z20822 Contact with and (suspected) exposure to covid-19: Secondary | ICD-10-CM | POA: Insufficient documentation

## 2020-10-13 HISTORY — DX: Dyspnea, unspecified: R06.00

## 2020-10-13 HISTORY — DX: Other specified postprocedural states: Z98.890

## 2020-10-13 HISTORY — DX: Anemia, unspecified: D64.9

## 2020-10-13 HISTORY — DX: Personal history of other medical treatment: Z92.89

## 2020-10-13 HISTORY — DX: Prediabetes: R73.03

## 2020-10-13 HISTORY — DX: Personal history of transient ischemic attack (TIA), and cerebral infarction without residual deficits: Z86.73

## 2020-10-13 HISTORY — DX: Personal history of other diseases of the digestive system: Z87.19

## 2020-10-13 LAB — CBC
HCT: 38 % (ref 36.0–46.0)
Hemoglobin: 11.1 g/dL — ABNORMAL LOW (ref 12.0–15.0)
MCH: 24.4 pg — ABNORMAL LOW (ref 26.0–34.0)
MCHC: 29.2 g/dL — ABNORMAL LOW (ref 30.0–36.0)
MCV: 83.5 fL (ref 80.0–100.0)
Platelets: 281 10*3/uL (ref 150–400)
RBC: 4.55 MIL/uL (ref 3.87–5.11)
RDW: 16.3 % — ABNORMAL HIGH (ref 11.5–15.5)
WBC: 6.6 10*3/uL (ref 4.0–10.5)
nRBC: 0 % (ref 0.0–0.2)

## 2020-10-13 LAB — COMPREHENSIVE METABOLIC PANEL
ALT: 13 U/L (ref 0–44)
AST: 19 U/L (ref 15–41)
Albumin: 3.6 g/dL (ref 3.5–5.0)
Alkaline Phosphatase: 61 U/L (ref 38–126)
Anion gap: 10 (ref 5–15)
BUN: 8 mg/dL (ref 8–23)
CO2: 25 mmol/L (ref 22–32)
Calcium: 9.5 mg/dL (ref 8.9–10.3)
Chloride: 103 mmol/L (ref 98–111)
Creatinine, Ser: 0.87 mg/dL (ref 0.44–1.00)
GFR, Estimated: >60 mL/min (ref >60)
Glucose, Bld: 146 mg/dL — ABNORMAL HIGH (ref 70–99)
Potassium: 4.2 mmol/L (ref 3.5–5.1)
Sodium: 138 mmol/L (ref 135–145)
Total Bilirubin: 0.4 mg/dL (ref 0.3–1.2)
Total Protein: 7.4 g/dL (ref 6.5–8.1)

## 2020-10-13 LAB — BRAIN NATRIURETIC PEPTIDE: B Natriuretic Peptide: 36.4 pg/mL (ref 0.0–100.0)

## 2020-10-13 LAB — TROPONIN I (HIGH SENSITIVITY): Troponin I (High Sensitivity): 4 ng/L (ref <18)

## 2020-10-13 MED ORDER — IOHEXOL 350 MG/ML SOLN
80.0000 mL | Freq: Once | INTRAVENOUS | Status: AC | PRN
  Administered 2020-10-13: 80 mL via INTRAVENOUS

## 2020-10-13 MED ORDER — SODIUM CHLORIDE 0.9 % IV BOLUS
500.0000 mL | Freq: Once | INTRAVENOUS | Status: AC
  Administered 2020-10-13: 500 mL via INTRAVENOUS

## 2020-10-13 NOTE — ED Triage Notes (Signed)
Pt BIB [facility] for fall from syncope not on blood thinners.  Pt began feeling hot and flushed at store. But recovered and made it home.  While home she experienced this again with some left sided Chest pressure and SOB, then passed out.  LOC for approx 3-4 min. Pt has been experiencing SOB with exertion since Fall 2019.  She and her doctors suspect she may have had undiagnosed covid. She has some hx of hypertension intermittently since fall 2019. Last pressure with EMS was 218/106.

## 2020-10-13 NOTE — ED Notes (Signed)
Pt back from CT

## 2020-10-13 NOTE — ED Notes (Signed)
Patient transported to CT 

## 2020-10-13 NOTE — ED Provider Notes (Signed)
[facility] Provider Note MRN:  [id number]  Arrival date & time: 10/13/20     Chief Complaint   Fall and Loss of Consciousness   History of Present Illness   Molly Flores is a 64 y.o. year-old female with a history of hypertension presenting to the ED with chief complaint of syncope.  Patient has been feeling dyspnea on exertion for the past 1 or 2 days. Today she has been feeling some chest pressure as well, center of the chest. She was walking at a store and she suddenly passed out. Denies any warning that she was going to pass out. She fell forward but did not hurt anything or hit her head. She continues to experience chest discomfort and shortness of breath. Denies any fever or cough or cold-like symptoms, no abdominal pain, no leg pain or swelling.  Review of Systems  A complete 10 system review of systems was obtained and all systems are negative except as noted in the HPI and PMH.   Patient's Health History    Past Medical History:  Diagnosis Date  . Hyperlipidemia   . Hypertension     Past Surgical History:  Procedure Laterality Date  . ABDOMINAL SURGERY    . LAPAROSCOPIC OOPHERECTOMY Bilateral     Family History  Problem Relation Age of Onset  . Diabetes Mother   . Hyperlipidemia Mother   . Hypertension Mother     Social History   Socioeconomic History  . Marital status: Single    Spouse name: Not on file  . Number of children: Not on file  . Years of education: Not on file  . Highest education level: Not on file  Occupational History  . Not on file  Tobacco Use  . Smoking status: Never Smoker  . Smokeless tobacco: Never Used  Substance and Sexual Activity  . Alcohol use: Never  . Drug use: Never  . Sexual activity: Not on file  Other Topics Concern  . Not on file  Social History Narrative  . Not on file   Social Determinants of Health   Financial Resource Strain:   . Difficulty of Paying Living  Expenses: Not on file  Food Insecurity:   . Worried About Programme researcher, broadcasting/film/video in the Last Year: Not on file  . Ran Out of Food in the Last Year: Not on file  Transportation Needs:   . Lack of Transportation (Medical): Not on file  . Lack of Transportation (Non-Medical): Not on file  Physical Activity:   . Days of Exercise per Week: Not on file  . Minutes of Exercise per Session: Not on file  Stress:   . Feeling of Stress : Not on file  Social Connections:   . Frequency of Communication with Friends and Family: Not on file  . Frequency of Social Gatherings with Friends and Family: Not on file  . Attends Religious Services: Not on file  . Active Member of Clubs or Organizations: Not on file  . Attends Banker Meetings: Not on file  . Marital Status: Not on file  Intimate Partner Violence:   . Fear of Current or Ex-Partner: Not on file  . Emotionally Abused: Not on file  . Physically Abused: Not on file  . Sexually Abused: Not on file     Physical Exam   Vitals:   10/13/20 2145 10/13/20 2300  BP: (!) 158/68 (!) 141/55  Pulse: 77 82  Resp: (!) 24 19  Temp:  SpO2: 98% 99%    CONSTITUTIONAL: Well-appearing, NAD NEURO:  Alert and oriented x 3, no focal deficits EYES:  eyes equal and reactive ENT/NECK:  no LAD, no JVD CARDIO: Regular rate, well-perfused, normal S1 and S2 PULM:  CTAB no wheezing or rhonchi GI/GU:  normal bowel sounds, non-distended, non-tender MSK/SPINE:  No gross deformities, no edema SKIN:  no rash, atraumatic PSYCH:  Appropriate speech and behavior  *Additional and/or pertinent findings included in MDM below  Diagnostic and Interventional Summary    EKG Interpretation  Date/Time:  Saturday October 13 2020 20:58:08 EST Ventricular Rate:  85 PR Interval:    QRS Duration: 90 QT Interval:  384 QTC Calculation: 457 R Axis:   39 Text Interpretation: Sinus rhythm Confirmed by Kennis Carina [phone]) on 10/13/2020 9:03:43 PM      Labs  Reviewed  CBC - Abnormal; Notable for the following components:      Result Value   Hemoglobin 11.1 (*)    MCH 24.4 (*)    MCHC 29.2 (*)    RDW 16.3 (*)    All other components within normal limits  RESPIRATORY PANEL BY RT PCR (FLU A&B, COVID)  COMPREHENSIVE METABOLIC PANEL  BRAIN NATRIURETIC PEPTIDE  TROPONIN I (HIGH SENSITIVITY)    DG Chest Port 1 View  Final Result    CT ANGIO CHEST PE W OR WO CONTRAST    (Results Pending)    Medications  sodium chloride 0.9 % bolus 500 mL (has no administration in time range)     Procedures  /  Critical Care Procedures  ED Course and Medical Decision Making  I have reviewed the triage vital signs, the nursing notes, and pertinent available records from the EMR.  Listed above are laboratory and imaging tests that I personally ordered, reviewed, and interpreted and then considered in my medical decision making (see below for details).  Concern for PE versus ACS in this 64 year old female history of hypertension, obesity. Does not smoke, no prior history of VTE however given the presentation with syncope there is high enough concern to obtain a CTA, troponin x2.  Signed out to oncoming provider shift change.  Would consider admission for chest pain rule out/telemetry for unprovoked syncope.       Elmer Sow. Pilar Plate, MD [facility] Emergency Medicine [facility] [email or web address]  Final Clinical Impressions(s) / ED Diagnoses     ICD-10-CM   1. Chest pain, unspecified type  R07.9   2. SOB (shortness of breath)  R06.02   3. Syncope, unspecified syncope type  R55     ED Discharge Orders    None       Discharge Instructions Discussed with and Provided to Patient:   Discharge Instructions   None       Sabas Sous, MD 10/13/20 2308

## 2020-10-14 ENCOUNTER — Encounter (HOSPITAL_COMMUNITY): Payer: Self-pay | Admitting: Internal Medicine

## 2020-10-14 ENCOUNTER — Observation Stay (HOSPITAL_BASED_OUTPATIENT_CLINIC_OR_DEPARTMENT_OTHER): Payer: 59

## 2020-10-14 DIAGNOSIS — R55 Syncope and collapse: Secondary | ICD-10-CM

## 2020-10-14 DIAGNOSIS — R918 Other nonspecific abnormal finding of lung field: Secondary | ICD-10-CM | POA: Diagnosis not present

## 2020-10-14 DIAGNOSIS — R0600 Dyspnea, unspecified: Secondary | ICD-10-CM

## 2020-10-14 DIAGNOSIS — R079 Chest pain, unspecified: Secondary | ICD-10-CM

## 2020-10-14 DIAGNOSIS — R0609 Other forms of dyspnea: Secondary | ICD-10-CM | POA: Diagnosis present

## 2020-10-14 LAB — ECHOCARDIOGRAM COMPLETE
Area-P 1/2: 2.91 cm2
Height: 65 in
S' Lateral: 3.05 cm
Weight: 4458.58 oz

## 2020-10-14 LAB — RESPIRATORY PANEL BY RT PCR (FLU A&B, COVID)
Influenza A by PCR: NEGATIVE
Influenza B by PCR: NEGATIVE
SARS Coronavirus 2 by RT PCR: NEGATIVE

## 2020-10-14 LAB — HEMOGLOBIN A1C
Hgb A1c MFr Bld: 6.4 % — ABNORMAL HIGH (ref 4.8–5.6)
Mean Plasma Glucose: 136.98 mg/dL

## 2020-10-14 LAB — LIPID PANEL
Cholesterol: 171 mg/dL (ref 0–200)
HDL: 36 mg/dL — ABNORMAL LOW (ref >40)
LDL Cholesterol: 106 mg/dL — ABNORMAL HIGH (ref 0–99)
Total CHOL/HDL Ratio: 4.8 RATIO
Triglycerides: 146 mg/dL (ref <150)
VLDL: 29 mg/dL (ref 0–40)

## 2020-10-14 LAB — TSH: TSH: 1.606 u[IU]/mL (ref 0.350–4.500)

## 2020-10-14 LAB — HIV ANTIBODY (ROUTINE TESTING W REFLEX): HIV Screen 4th Generation wRfx: NONREACTIVE

## 2020-10-14 LAB — TROPONIN I (HIGH SENSITIVITY): Troponin I (High Sensitivity): 4 ng/L (ref <18)

## 2020-10-14 MED ORDER — ASPIRIN 81 MG PO CHEW
81.0000 mg | CHEWABLE_TABLET | Freq: Every day | ORAL | Status: DC
  Administered 2020-10-14 – 2020-10-15 (×2): 81 mg via ORAL
  Filled 2020-10-14 (×2): qty 1

## 2020-10-14 MED ORDER — ACETAMINOPHEN 325 MG PO TABS
650.0000 mg | ORAL_TABLET | ORAL | Status: DC | PRN
  Administered 2020-10-14 – 2020-10-15 (×2): 650 mg via ORAL
  Filled 2020-10-14 (×2): qty 2

## 2020-10-14 MED ORDER — METOPROLOL TARTRATE 50 MG PO TABS
50.0000 mg | ORAL_TABLET | Freq: Two times a day (BID) | ORAL | Status: DC
  Administered 2020-10-14: 50 mg via ORAL
  Filled 2020-10-14: qty 1

## 2020-10-14 MED ORDER — ONDANSETRON HCL 4 MG/2ML IJ SOLN: 4.0000 mg | Freq: Four times a day (QID) | INTRAMUSCULAR | Status: DC | PRN

## 2020-10-14 MED ORDER — ENOXAPARIN SODIUM 60 MG/0.6ML ~~LOC~~ SOLN
60.0000 mg | Freq: Every day | SUBCUTANEOUS | Status: DC
  Administered 2020-10-14 (×2): 60 mg via SUBCUTANEOUS
  Filled 2020-10-14 (×3): qty 0.6

## 2020-10-14 MED ORDER — METOPROLOL TARTRATE 25 MG PO TABS
25.0000 mg | ORAL_TABLET | Freq: Two times a day (BID) | ORAL | Status: DC
  Administered 2020-10-14: 25 mg via ORAL
  Filled 2020-10-14: qty 1

## 2020-10-14 MED ORDER — ATORVASTATIN CALCIUM 40 MG PO TABS
40.0000 mg | ORAL_TABLET | Freq: Every day | ORAL | Status: DC
  Administered 2020-10-14 – 2020-10-15 (×2): 40 mg via ORAL
  Filled 2020-10-14 (×2): qty 1

## 2020-10-14 NOTE — Consult Note (Signed)
Cardiology Consultation:   Patient ID: Molly Flores MRN: [id number]; DOB: Apr 11, 1956  Admit date: 10/13/2020 Date of Consult: 10/14/2020  Primary Care Provider: Adrienne Mocha, PA [facility] Cardiologist: new    [facility] Electrophysiologist:  None     Patient Profile:   Molly Flores is a 64 y.o. female with a hx of hypertension, hyperlipidemia, prior stroke, pulmonary nodule and recent evaluation by pulmonology for ongoing shortness of breath since viral syndrome in February 2020 who is being seen today for the evaluation of chest pain and syncope at the request of Dr. Lyda Perone.  History of Present Illness:   Molly Flores started having L sided chest pressure a couple of days ago.  She has had fairly constant pressure.  It does radiate down her L arm.  She has no associated diaphoresis, nausea or back pain. She also started feeling more short of breath with just minimal activity 2 days ago as well as feeling lightheaded. She was at the store yesterday and felt near syncopal and somewhat diaphoretic.  She notes she had to lean up against a cart.  She went home and was relaxing, playing bingo with some friends.  She does not remember anything after this.  Her friend told her she got up from the table to go to the other room and they heard her fall.  The patient has no recollection of this.    Of note, she has recently been evaluated by pulmonology for lung nodule as well as ongoing shortness of breath since a viral illness in 01/2019.  Recent PFTs showed just mild restriction and no change with bronchodilator.  ED data from 10/14/2020 K+ 4.2, creatinine 0.87, albumin 3.6, ALT 13, triglycerides 146, LDL 106, Hgb 11.1 Hemoglobin A1c 6.4 Hs-Trop 4 >> 4 BNP 36.4 SARS-CoV-2 negative Influenza A, influenza B negative Chest CTA: No pulmonary embolism, aortic atherosclerosis CXR: Mild cardiomegaly with mild vascular congestion EKG: Normal sinus rhythm, heart rate 85, normal axis, no  ST-T wave changes, QTC 457  Past Medical History:  Diagnosis Date  . Borderline diabetes   . Carotid US    [facility] 01/2019: no ICA stenosis  . History of cardiac catheterization    [facility] 10/2012:  no CAD  . History of nuclear stress test    [facility]iew [facility] 01/2019: no ischemia  . History of small bowel obstruction   . History of TIA (transient ischemic attack)   . Hyperlipidemia   . Hypertension     Past Surgical History:  Procedure Laterality Date  . ABDOMINAL SURGERY    . LAPAROSCOPIC OOPHERECTOMY Bilateral      Home Medications:  Prior to Admission medications   Medication Sig Start Date End Date Taking? Authorizing Provider  aspirin 81 MG chewable tablet Chew 81 mg by mouth daily.   Yes [provider]  ibuprofen (ADVIL) 200 MG tablet Take 800 mg by mouth every 6 (six) hours as needed for mild pain (or headaches).    Yes [provider]  esomeprazole (NEXIUM) 40 MG capsule Take by mouth. 02/24/19 03/02/20  [provider]    Inpatient Medications: Scheduled Meds: . aspirin  81 mg Oral Daily  . enoxaparin (LOVENOX) injection  60 mg Subcutaneous QHS   Continuous Infusions:  PRN Meds: acetaminophen, ondansetron (ZOFRAN) IV  Allergies:    Allergies  Allergen Reactions  . Oxycodone Other (See Comments)    Hallucinations     Social History:   Social History   Socioeconomic History  .  Marital status: Divorced    Spouse name: Not on file  . Number of children: 1  . Years of education: Not on file  . Highest education level: Not on file  Occupational History  . Occupation: Nanny  Tobacco Use  . Smoking status: Never Smoker  . Smokeless tobacco: Never Used  Substance and Sexual Activity  . Alcohol use: Never  . Drug use: Never  . Sexual activity: Not on file  Other Topics Concern  . Not on file  Social History Narrative  . Not on file   Social Determinants of Health   Financial Resource Strain:   . Difficulty of Paying Living  Expenses: Not on file  Food Insecurity:   . Worried About Programme researcher, broadcasting/film/video in the Last Year: Not on file  . Ran Out of Food in the Last Year: Not on file  Transportation Needs:   . Lack of Transportation (Medical): Not on file  . Lack of Transportation (Non-Medical): Not on file  Physical Activity:   . Days of Exercise per Week: Not on file  . Minutes of Exercise per Session: Not on file  Stress:   . Feeling of Stress : Not on file  Social Connections:   . Frequency of Communication with Friends and Family: Not on file  . Frequency of Social Gatherings with Friends and Family: Not on file  . Attends Religious Services: Not on file  . Active Member of Clubs or Organizations: Not on file  . Attends Banker Meetings: Not on file  . Marital Status: Not on file  Intimate Partner Violence:   . Fear of Current or Ex-Partner: Not on file  . Emotionally Abused: Not on file  . Physically Abused: Not on file  . Sexually Abused: Not on file    Family History:    Family History  Problem Relation Age of Onset  . Diabetes Mother   . Hyperlipidemia Mother   . Hypertension Mother      ROS:  Please see the history of present illness.  No fevers, chills, cough, melena, hematochezia, vomiting, diarrhea. All other ROS reviewed and negative.     Physical Exam/Data:   Vitals:   10/14/20 1000 10/14/20 1030 10/14/20 1113 10/14/20 1130  BP: (!) 141/73 (!) 142/73  (!) 172/101  Pulse: 66 63  78  Resp: 15 16  (!) 24  Temp:   98.1 F (36.7 C)   TempSrc:   Oral   SpO2: 100% 99%  98%  Weight:      Height:        Intake/Output Summary (Last 24 hours) at 10/14/2020 1208 Last data filed at 10/14/2020 0033 Gross per 24 hour  Intake 509.25 ml  Output --  Net 509.25 ml   Last 3 Weights 10/14/2020 04/12/2020 03/02/2020  Weight (lbs) 278 lb 10.6 oz 278 lb 9.6 oz 260 lb  Weight (kg) 126.4 kg 126.372 kg 117.935 kg     Body mass index is 46.37 kg/m.  General:  Well nourished,  well developed, in no acute distress  HEENT: normal Lymph: no adenopathy Neck: no JVD Endocrine:  No thryomegaly Vascular: No carotid bruits; DP/PT 2+ bilat.  Cardiac:  normal S1, S2; RRR; 2/6 systolic murmur RUSB Lungs:  clear to auscultation bilaterally, no wheezing, rhonchi or rales  Abd: soft, nontender, no hepatomegaly  Ext: no edema Musculoskeletal:  No deformities equal Skin: warm and dry  Neuro:  CNs 2-12 intact, no focal abnormalities noted Psych:  Normal  affect   EKG:  See above   Relevant CV Studies: Echocardiogram pending   Laboratory Data:  High Sensitivity Troponin:   Recent Labs  Lab 10/13/20 2144 10/13/20 2350  TROPONINIHS 4 4     Chemistry Recent Labs  Lab 10/13/20 2144  NA 138  K 4.2  CL 103  CO2 25  GLUCOSE 146*  BUN 8  CREATININE 0.87  CALCIUM 9.5  GFRNONAA >60  ANIONGAP 10    Recent Labs  Lab 10/13/20 2144  PROT 7.4  ALBUMIN 3.6  AST 19  ALT 13  ALKPHOS 61  BILITOT 0.4   Hematology Recent Labs  Lab 10/13/20 2144  WBC 6.6  RBC 4.55  HGB 11.1*  HCT 38.0  MCV 83.5  MCH 24.4*  MCHC 29.2*  RDW 16.3*  PLT 281   BNP Recent Labs  Lab 10/13/20 2144  BNP 36.4    DDimer No results for input(s): DDIMER in the last 168 hours.   Radiology/Studies:  CT ANGIO CHEST PE W OR WO CONTRAST  Result Date: 10/14/2020 CLINICAL DATA:  Syncope, fall, dyspnea, chest pressure EXAM: CT ANGIOGRAPHY CHEST WITH CONTRAST TECHNIQUE: Multidetector CT imaging of the chest was performed using the standard protocol during bolus administration of intravenous contrast. Multiplanar CT image reconstructions and MIPs were obtained to evaluate the vascular anatomy. CONTRAST:  80mL OMNIPAQUE IOHEXOL 350 MG/ML SOLN COMPARISON:  03/16/2020 FINDINGS: Cardiovascular: Satisfactory opacification of the pulmonary arteries to the segmental level. No evidence of pulmonary embolism. Normal heart size. No pericardial effusion. The thoracic aorta is unremarkable save for  bovine arch anatomy. Mediastinum/Nodes: No enlarged mediastinal, hilar, or axillary lymph nodes. Thyroid gland, trachea, and esophagus demonstrate no significant findings. Lungs/Pleura: Evaluation of the pulmonary parenchyma is slightly limited by motion artifact. 7 mm noncalcified pulmonary nodule within the lingula may have enlarged slightly in the interval since prior examination, this may be in part artifactual and related to motion artifact. Intrapulmonary lymph node along the minor fissure is not well visualized. Previously noted ground-glass pulmonary infiltrate is not well appreciated on the current examination. No new focal pulmonary infiltrates or nodules. No pneumothorax or pleural effusion. Central airways are widely patent. Upper Abdomen: No acute abnormality. Musculoskeletal: No acute bone abnormality Review of the MIP images confirms the above findings. IMPRESSION: No pulmonary embolism. No radiographic explanation for the patient's reported symptoms. Previously noted pulmonary nodules are not optimally assessed on this examination due to motion artifact. Follow-up evaluation, as outlined on prior report in 12 months from that examination (approximately 03/2021) is recommended for definitive evaluation. Aortic Atherosclerosis (ICD10-I70.0). Electronically Signed   By: Helyn NumbersAshesh  Parikh MD   On: 10/14/2020 00:25   DG Chest Port 1 View  Result Date: 10/13/2020 CLINICAL DATA:  64 year old female with shortness of breath. EXAM: PORTABLE CHEST 1 VIEW COMPARISON:  Chest CT dated 03/16/2020. FINDINGS: There is mild cardiomegaly with mild vascular congestion. No focal consolidation, pleural effusion or pneumothorax. No acute osseous pathology. IMPRESSION: Mild cardiomegaly with mild vascular congestion. No focal consolidation. Electronically Signed   By: Elgie CollardArash  Radparvar M.D.   On: 10/13/2020 22:13   ECHOCARDIOGRAM COMPLETE  Result Date: 10/14/2020    ECHOCARDIOGRAM REPORT   Patient Name:   Molly MiuMARILYN  Flores Date of Exam: 10/14/2020 Medical Rec #:  [id number]    Height:       65.0 in Accession #:    [id number]   Weight:       278.7 lb Date of Birth:  October 26, 1956    BSA:  2.278 m Patient Age:    64 years     BP:           164/77 mmHg Patient Gender: F            HR:           76 bpm. Exam Location:  Inpatient Procedure: 2D Echo, Cardiac Doppler and Color Doppler Indications:    R55 Syncope; R07.9* Chest pain, unspecified  History:        Patient has no prior history of Echocardiogram examinations.                 Risk Factors:Hypertension and Dyslipidemia.  Sonographer:    Elmarie Shiley Dance Referring Phys: [id number] JARED M GARDNER IMPRESSIONS  1. Left ventricular ejection fraction, by estimation, is 60 to 65%. The left ventricle has normal function. The left ventricle has no regional wall motion abnormalities. Left ventricular diastolic parameters were normal.  2. Right ventricular systolic function is normal. The right ventricular size is normal.  3. The mitral valve is normal in structure. No evidence of mitral valve regurgitation. No evidence of mitral stenosis.  4. The aortic valve is tricuspid. Aortic valve regurgitation is not visualized. No aortic stenosis is present.  5. The inferior vena cava is normal in size with greater than 50% respiratory variability, suggesting right atrial pressure of 3 mmHg. FINDINGS  Left Ventricle: Left ventricular ejection fraction, by estimation, is 60 to 65%. The left ventricle has normal function. The left ventricle has no regional wall motion abnormalities. The left ventricular internal cavity size was normal in size. There is  no left ventricular hypertrophy. Left ventricular diastolic parameters were normal. Right Ventricle: The right ventricular size is normal.Right ventricular systolic function is normal. Left Atrium: Left atrial size was normal in size. Right Atrium: Right atrial size was normal in size. Pericardium: There is no evidence of pericardial effusion. Mitral  Valve: The mitral valve is normal in structure. No evidence of mitral valve regurgitation. No evidence of mitral valve stenosis. Tricuspid Valve: The tricuspid valve is normal in structure. Tricuspid valve regurgitation is trivial. No evidence of tricuspid stenosis. Aortic Valve: The aortic valve is tricuspid. Aortic valve regurgitation is not visualized. No aortic stenosis is present. Pulmonic Valve: The pulmonic valve was normal in structure. Pulmonic valve regurgitation is not visualized. No evidence of pulmonic stenosis. Aorta: The aortic root is normal in size and structure. Venous: The inferior vena cava is normal in size with greater than 50% respiratory variability, suggesting right atrial pressure of 3 mmHg.  LEFT VENTRICLE PLAX 2D LVIDd:         3.96 cm Diastology LVIDs:         3.05 cm LV e' medial:    8.05 cm/s LV PW:         1.03 cm LV E/e' medial:  11.8 LV IVS:        0.86 cm LV e' lateral:   10.80 cm/s                        LV E/e' lateral: 8.8  RIGHT VENTRICLE             IVC RV Basal diam:  3.16 cm     IVC diam: 1.77 cm RV Mid diam:    2.00 cm RV S prime:     12.20 cm/s TAPSE (M-mode): 2.0 cm LEFT ATRIUM             Index  RIGHT ATRIUM           Index LA diam:        4.00 cm 1.76 cm/m  RA Area:     17.80 cm LA Vol (A2C):   48.6 ml 21.34 ml/m RA Volume:   54.00 ml  23.71 ml/m LA Vol (A4C):   57.0 ml 25.03 ml/m LA Biplane Vol: 53.4 ml 23.45 ml/m  AORTIC VALVE LVOT Vmax:   114.00 cm/s LVOT Vmean:  68.100 cm/s LVOT VTI:    0.244 m  AORTA Ao Asc diam: 3.00 cm MITRAL VALVE MV Area (PHT): 2.91 cm    SHUNTS MV Decel Time: 261 msec    Systemic VTI: 0.24 m MV E velocity: 94.60 cm/s MV A velocity: 80.50 cm/s MV E/A ratio:  1.18 Olga Millers MD Electronically signed by Olga Millers MD Signature Date/Time: 10/14/2020/10:43:36 AM    Final      Assessment and Plan:   1. Syncope The description of her syncope sounds suspicious for vasovagal syncope.  However, when she did pass out, she had  no recollection of the events.  In review of her chart, she has been seen at another institution (in [place]) for syncope with an unremarkable cardiac monitor.  She had a normal EF on Myoview in 01/2019.  Her ECG is normal with conduction system disease or pre-excitation.  Echocardiogram is pending.  If EF normal, consider sending home with an event monitor.  She will likely need to be restricted from driving for 6 mos given the circumstances surrounding her syncopal event.  2. Chest pain  She has some typical and atypical features.  Her hs-Trop is neg.  Echocardiogram is pending.  She had a normal cath in 2013.  She has borderline diabetes mellitus and hypertension.  She is at risk for obstructive coronary artery disease.  Will plan on coronary CTA tomorrow.  If her EF is down, she will likely need cardiac catheterization.  Continue ASA.  Start Atorvastatin 40 mg once daily.  Start Metoprolol tartrate 25 mg twice daily.  With neg cardiac markers, no need for IV heparin.    3. Hypertension  Uncontrolled.  Start Metoprolol tartrate 25 mg twice daily.  Continue to monitor and add ACE or Amlodipine if remains uncontrolled.    4. Hyperlipidemia  Start Atorvastatin 40 mg once daily.  Aortic atherosclerosis noted on CT and she has borderline diabetes mellitus.        HEAR Score (for undifferentiated chest pain):  HEAR Score: 4      For questions or updates, please contact [facility] Please consult [email or web address] for contact info under    Signed, Tereso Newcomer, PA-C  10/14/2020 12:08 PM

## 2020-10-14 NOTE — ED Provider Notes (Signed)
Care assumed from Dr. Pilar Plate, patient with syncope and chest pain pending CT angiogram of chest and repeat troponin.  CT angiogram of the chest showed no evidence of pulmonary embolism, repeat troponin is normal.  However, syncope occurred without a prodrome and is high risk and I feel she needs to be admitted for ongoing cardiac monitoring.  Case is discussed with Dr. Julian Reil of [facility], who agrees to admit the patient.  Results for orders placed or performed during the hospital encounter of 10/13/20  CBC  Result Value Ref Range   WBC 6.6 4.0 - 10.5 K/uL   RBC 4.55 3.87 - 5.11 MIL/uL   Hemoglobin 11.1 (L) 12.0 - 15.0 g/dL   HCT 54.2 36 - 46 %   MCV 83.5 80.0 - 100.0 fL   MCH 24.4 (L) 26.0 - 34.0 pg   MCHC 29.2 (L) 30.0 - 36.0 g/dL   RDW 70.6 (H) 23.7 - 62.8 %   Platelets 281 150 - 400 K/uL   nRBC 0.0 0.0 - 0.2 %  Comprehensive metabolic panel  Result Value Ref Range   Sodium 138 135 - 145 mmol/L   Potassium 4.2 3.5 - 5.1 mmol/L   Chloride 103 98 - 111 mmol/L   CO2 25 22 - 32 mmol/L   Glucose, Bld 146 (H) 70 - 99 mg/dL   BUN 8 8 - 23 mg/dL   Creatinine, Ser 3.15 0.44 - 1.00 mg/dL   Calcium 9.5 8.9 - 17.6 mg/dL   Total Protein 7.4 6.5 - 8.1 g/dL   Albumin 3.6 3.5 - 5.0 g/dL   AST 19 15 - 41 U/L   ALT 13 0 - 44 U/L   Alkaline Phosphatase 61 38 - 126 U/L   Total Bilirubin 0.4 0.3 - 1.2 mg/dL   GFR, Estimated >16 >07 mL/min   Anion gap 10 5 - 15  Brain natriuretic peptide  Result Value Ref Range   B Natriuretic Peptide 36.4 0.0 - 100.0 pg/mL  Troponin I (High Sensitivity)  Result Value Ref Range   Troponin I (High Sensitivity) 4 <18 ng/L  Troponin I (High Sensitivity)  Result Value Ref Range   Troponin I (High Sensitivity) 4 <18 ng/L   CT ANGIO CHEST PE W OR WO CONTRAST  Result Date: 10/14/2020 CLINICAL DATA:  Syncope, fall, dyspnea, chest pressure EXAM: CT ANGIOGRAPHY CHEST WITH CONTRAST TECHNIQUE: Multidetector CT imaging of the chest was performed using the  standard protocol during bolus administration of intravenous contrast. Multiplanar CT image reconstructions and MIPs were obtained to evaluate the vascular anatomy. CONTRAST:  74mL OMNIPAQUE IOHEXOL 350 MG/ML SOLN COMPARISON:  03/16/2020 FINDINGS: Cardiovascular: Satisfactory opacification of the pulmonary arteries to the segmental level. No evidence of pulmonary embolism. Normal heart size. No pericardial effusion. The thoracic aorta is unremarkable save for bovine arch anatomy. Mediastinum/Nodes: No enlarged mediastinal, hilar, or axillary lymph nodes. Thyroid gland, trachea, and esophagus demonstrate no significant findings. Lungs/Pleura: Evaluation of the pulmonary parenchyma is slightly limited by motion artifact. 7 mm noncalcified pulmonary nodule within the lingula may have enlarged slightly in the interval since prior examination, this may be in part artifactual and related to motion artifact. Intrapulmonary lymph node along the minor fissure is not well visualized. Previously noted ground-glass pulmonary infiltrate is not well appreciated on the current examination. No new focal pulmonary infiltrates or nodules. No pneumothorax or pleural effusion. Central airways are widely patent. Upper Abdomen: No acute abnormality. Musculoskeletal: No acute bone abnormality Review of the MIP images confirms the above  findings. IMPRESSION: No pulmonary embolism. No radiographic explanation for the patient's reported symptoms. Previously noted pulmonary nodules are not optimally assessed on this examination due to motion artifact. Follow-up evaluation, as outlined on prior report in 12 months from that examination (approximately 03/2021) is recommended for definitive evaluation. Aortic Atherosclerosis (ICD10-I70.0). Electronically Signed   By: Helyn Numbers MD   On: 10/14/2020 00:25   DG Chest Port 1 View  Result Date: 10/13/2020 CLINICAL DATA:  64 year old female with shortness of breath. EXAM: PORTABLE CHEST 1  VIEW COMPARISON:  Chest CT dated 03/16/2020. FINDINGS: There is mild cardiomegaly with mild vascular congestion. No focal consolidation, pleural effusion or pneumothorax. No acute osseous pathology. IMPRESSION: Mild cardiomegaly with mild vascular congestion. No focal consolidation. Electronically Signed   By: Elgie Collard M.D.   On: 10/13/2020 22:13   Images viewed by me.    Dione Booze, MD 10/14/20 [phone]

## 2020-10-14 NOTE — Progress Notes (Signed)
  Echocardiogram 2D Echocardiogram has been performed.  Molly Flores 10/14/2020, 10:31 AM

## 2020-10-14 NOTE — H&P (Signed)
History and Physical    Molly Flores LZJ:[id number] DOB: October 24, 1956 DOA: 10/13/2020  PCP: Adrienne Mocha, PA  Patient coming from: Home  I have personally briefly reviewed patient's old medical records in [facility]  Chief Complaint: CP, syncope  HPI: Molly Flores is a 64 y.o. female with medical history significant of HTN, obesity.  Pt has been having DOE for some months now.  Seeing pulmonology as outpt though from Dr. Isaiah Serge 06/07/20 note PFTs didn't sound very impressive.  For the past 1-2 days has had increased DOE, today had some chest pressure as well, located in central of chest, pressure quality.  While walking at store she had sudden onset of syncope.  No warning that she was going to pass out.  Larey Seat forward but didn't hit head.  Continues to have some chest discomfort and SOB.  No fever, no cough, no abd pain, no leg swelling or pain.   ED Course: CT neg for PE.  Trop neg X2.  BNP nl.  Patient takes a baby ASA daily but otherwise says shes not on any chronic medications at home currently.   Review of Systems: As per HPI, otherwise all review of systems negative.  Past Medical History:  Diagnosis Date  . Hyperlipidemia   . Hypertension     Past Surgical History:  Procedure Laterality Date  . ABDOMINAL SURGERY    . LAPAROSCOPIC OOPHERECTOMY Bilateral      reports that she has never smoked. She has never used smokeless tobacco. She reports that she does not drink alcohol and does not use drugs.  Allergies  Allergen Reactions  . Oxycodone Other (See Comments)    Hallucinations     Family History  Problem Relation Age of Onset  . Diabetes Mother   . Hyperlipidemia Mother   . Hypertension Mother      Prior to Admission medications   Medication Sig Start Date End Date Taking? Authorizing Provider  atorvastatin (LIPITOR) 10 MG tablet Take by mouth. 08/30/19   [provider]  budesonide-formoterol (SYMBICORT) 160-4.5 MCG/ACT inhaler Inhale 2  puffs into the lungs 2 (two) times daily. 04/12/20   Mannam, Colbert Coyer, MD  ferrous sulfate 325 (65 FE) MG tablet Take by mouth.    [provider]  ibuprofen (ADVIL) 200 MG tablet Take by mouth.    [provider]  loratadine (CLARITIN) 10 MG tablet Take by mouth.    [provider]  mometasone-formoterol (DULERA) 200-5 MCG/ACT AERO Inhale 2 puffs into the lungs in the morning and at bedtime. 05/15/20   Chilton Greathouse, MD  Multiple Vitamin (MULTI-VITAMIN) tablet Take by mouth.    [provider]  Omega-3 Fatty Acids (FISH OIL) 1000 MG CAPS Take by mouth.    [provider]  esomeprazole (NEXIUM) 40 MG capsule Take by mouth. 02/24/19 03/02/20  [provider]    Physical Exam: Vitals:   10/14/20 0030 10/14/20 0100 10/14/20 0130 10/14/20 0200  BP: (!) 146/62 134/69 134/67 (!) 162/68  Pulse: 84 85 82 86  Resp: 18 20 19 16   Temp:      TempSrc:      SpO2: 97% 98% 97% 100%  Weight:    126.4 kg  Height:    5\' 5"  (1.651 m)    Constitutional: NAD, calm, comfortable Eyes: PERRL, lids and conjunctivae normal ENMT: Mucous membranes are moist. Posterior pharynx clear of any exudate or lesions.Normal dentition.  Neck: normal, supple, no masses, no thyromegaly Respiratory: clear to auscultation bilaterally,  no wheezing, no crackles. Normal respiratory effort. No accessory muscle use.  Cardiovascular: Regular rate and rhythm, no murmurs / rubs / gallops. No extremity edema. 2+ pedal pulses. No carotid bruits.  Abdomen: no tenderness, no masses palpated. No hepatosplenomegaly. Bowel sounds positive.  Musculoskeletal: no clubbing / cyanosis. No joint deformity upper and lower extremities. Good ROM, no contractures. Normal muscle tone.  Skin: no rashes, lesions, ulcers. No induration Neurologic: CN 2-12 grossly intact. Sensation intact, DTR normal. Strength 5/5 in all 4.  Psychiatric: Normal judgment and insight. Alert and oriented x 3. Normal mood.     Labs on Admission: I have personally reviewed following labs and imaging studies  CBC: Recent Labs  Lab 10/13/20 2144  WBC 6.6  HGB 11.1*  HCT 38.0  MCV 83.5  PLT 281   Basic Metabolic Panel: Recent Labs  Lab 10/13/20 2144  NA 138  K 4.2  CL 103  CO2 25  GLUCOSE 146*  BUN 8  CREATININE 0.87  CALCIUM 9.5   GFR: Estimated Creatinine Clearance: 88.6 mL/min (by C-G formula based on SCr of 0.87 mg/dL). Liver Function Tests: Recent Labs  Lab 10/13/20 2144  AST 19  ALT 13  ALKPHOS 61  BILITOT 0.4  PROT 7.4  ALBUMIN 3.6   No results for input(s): LIPASE, AMYLASE in the last 168 hours. No results for input(s): AMMONIA in the last 168 hours. Coagulation Profile: No results for input(s): INR, PROTIME in the last 168 hours. Cardiac Enzymes: No results for input(s): CKTOTAL, CKMB, CKMBINDEX, TROPONINI in the last 168 hours. BNP (last 3 results) No results for input(s): PROBNP in the last 8760 hours. HbA1C: No results for input(s): HGBA1C in the last 72 hours. CBG: No results for input(s): GLUCAP in the last 168 hours. Lipid Profile: No results for input(s): CHOL, HDL, LDLCALC, TRIG, CHOLHDL, LDLDIRECT in the last 72 hours. Thyroid Function Tests: No results for input(s): TSH, T4TOTAL, FREET4, T3FREE, THYROIDAB in the last 72 hours. Anemia Panel: No results for input(s): VITAMINB12, FOLATE, FERRITIN, TIBC, IRON, RETICCTPCT in the last 72 hours. Urine analysis: No results found for: COLORURINE, APPEARANCEUR, LABSPEC, PHURINE, GLUCOSEU, HGBUR, BILIRUBINUR, KETONESUR, PROTEINUR, UROBILINOGEN, NITRITE, LEUKOCYTESUR  Radiological Exams on Admission: CT ANGIO CHEST PE W OR WO CONTRAST  Result Date: 10/14/2020 CLINICAL DATA:  Syncope, fall, dyspnea, chest pressure EXAM: CT ANGIOGRAPHY CHEST WITH CONTRAST TECHNIQUE: Multidetector CT imaging of the chest was performed using the standard protocol during bolus administration of intravenous contrast. Multiplanar CT image  reconstructions and MIPs were obtained to evaluate the vascular anatomy. CONTRAST:  56mL OMNIPAQUE IOHEXOL 350 MG/ML SOLN COMPARISON:  03/16/2020 FINDINGS: Cardiovascular: Satisfactory opacification of the pulmonary arteries to the segmental level. No evidence of pulmonary embolism. Normal heart size. No pericardial effusion. The thoracic aorta is unremarkable save for bovine arch anatomy. Mediastinum/Nodes: No enlarged mediastinal, hilar, or axillary lymph nodes. Thyroid gland, trachea, and esophagus demonstrate no significant findings. Lungs/Pleura: Evaluation of the pulmonary parenchyma is slightly limited by motion artifact. 7 mm noncalcified pulmonary nodule within the lingula may have enlarged slightly in the interval since prior examination, this may be in part artifactual and related to motion artifact. Intrapulmonary lymph node along the minor fissure is not well visualized. Previously noted ground-glass pulmonary infiltrate is not well appreciated on the current examination. No new focal pulmonary infiltrates or nodules. No pneumothorax or pleural effusion. Central airways are widely patent. Upper Abdomen: No acute abnormality. Musculoskeletal: No acute bone abnormality Review of the MIP images confirms the above findings.  IMPRESSION: No pulmonary embolism. No radiographic explanation for the patient's reported symptoms. Previously noted pulmonary nodules are not optimally assessed on this examination due to motion artifact. Follow-up evaluation, as outlined on prior report in 12 months from that examination (approximately 03/2021) is recommended for definitive evaluation. Aortic Atherosclerosis (ICD10-I70.0). Electronically Signed   By: Helyn Numbers MD   On: 10/14/2020 00:25   DG Chest Port 1 View  Result Date: 10/13/2020 CLINICAL DATA:  64 year old female with shortness of breath. EXAM: PORTABLE CHEST 1 VIEW COMPARISON:  Chest CT dated 03/16/2020. FINDINGS: There is mild cardiomegaly with mild  vascular congestion. No focal consolidation, pleural effusion or pneumothorax. No acute osseous pathology. IMPRESSION: Mild cardiomegaly with mild vascular congestion. No focal consolidation. Electronically Signed   By: Elgie Collard M.D.   On: 10/13/2020 22:13    EKG: Independently reviewed.  EKG has new Q waves in Lead III that wernt present in April of this year.  Assessment/Plan Principal Problem:   Chest pain, rule out acute myocardial infarction Active Problems:   Syncope   DOE (dyspnea on exertion)   Pulmonary nodules    1. CP r/o - 1. CP obs pathway 2. Tele monitor 3. NPO 4. Check FLP and A1C 5. Cards eval in AM. 2. Syncope - 1. 2d echo ordered 2. Tele monitor 3. Pulmonary nodule(s) - 1. Seen on previous CT 2. Not evaluated on todays CT due to motion artifact 3. Radiologist rec repeat CT in April 2022  DVT prophylaxis: Lovenox Code Status: Full Family Communication: Family at bedside Disposition Plan: Home after CP and syncope ruleout Consults called: Message sent to P. Trent for cards eval in AM Admission status: Place in [place]    Chosen Garron, [place] M. DO [facility]  How to contact the [facility] Attending or Consulting provider 7A - 7P or covering provider during after hours 7P -7A, for this patient?  1. Check the care team in [facility] and look for a) attending/consulting [facility] provider listed and b) the [facility] team listed 2. Log into [email or web address]  Amion Physician Scheduling and messaging for groups and whole hospitals  On call and physician scheduling software for group practices, residents, hospitalists and other medical providers for call, clinic, rotation and shift schedules. On[employer] is a hospital-wide system for scheduling doctors and paging doctors on call. EasyPlot is for scientific plotting and data analysis.  [email or web address]  and use [place]'s universal password to access. If you do not have the password, please contact the hospital operator.   3. Locate the [facility] provider you are looking for under [facility] and page to a number that you can be directly reached. 4. If you still have difficulty reaching the provider, please page the [facility] (Director on Call) for the Hospitalists listed on amion for assistance.  10/14/2020, 2:40 AM

## 2020-10-14 NOTE — Progress Notes (Signed)
PROGRESS NOTE  Molly Flores MMN:[id number] DOB: Aug 13, 1956 DOA: 10/13/2020 PCP: Adrienne Mocha, PA  Brief History   Molly Flores is a 64 y.o. female with medical history significant of HTN, obesity.  Pt has been having DOE for some months now.  Seeing pulmonology as outpt though from Dr. Isaiah Serge 06/07/20 note PFTs didn't sound very impressive.  For the past 1-2 days has had increased DOE, today had some chest pressure as well, located in central of chest, pressure quality.  While walking at store she had sudden onset of syncope.  No warning that she was going to pass out.  Larey Seat forward but didn't hit head.  Continues to have some chest discomfort and SOB.  No fever, no cough, no abd pain, no leg swelling or pain.  In the ED the patient was found to have a  CT neg for PE.  Trop neg X2.  BNP nl.  Patient takes a baby ASA daily but otherwise says shes not on any chronic medications at home currently.  [facility] was consulted to admit the patient for further evaluation and treatment. She has been ruled out for MI by EKG and enzyme criteria. Echocardiogram has been ordered and cardiology is consulted.   Cardiology has evaluated the patient. The patient will go for Coronary CTA in the morning to define coronary anatomy. Echocardiogram demonstrated an EF of 60-65% with normal LV function and no regional wall motion abnormalities. LV diastolic parameters were normal. RV systolic function is normal.   Consultants  . Cardiology  Procedures  . none  Antibiotics   Anti-infectives (From admission, onward)   None    .  Subjective  The patient is resting comfortably, although she continues to complain of chest pain. No new complaints.  Objective   Vitals:  Vitals:   10/14/20 1303 10/14/20 1421  BP: (!) 171/85   Pulse:  66  Resp:  (!) 22  Temp:    SpO2:  100%   Exam:  Constitutional:  . The patient is awake, alert, and oriented x 3. No acute distress. Respiratory:  . No  increased work of breathing. . No wheezes, rales, or rhonchi . No tactile fremitus Cardiovascular:  . Regular rate and rhythm . No murmurs, ectopy, or gallups. . No lateral PMI. No thrills. Abdomen:  . Abdomen is soft, non-tender, non-distended . No hernias, masses, or organomegaly . Normoactive bowel sounds.  Musculoskeletal:  . No cyanosis, clubbing, or edema Skin:  . No rashes, lesions, ulcers . palpation of skin: no induration or nodules Neurologic:  . CN 2-12 intact . Sensation all 4 extremities intact Psychiatric:  . Mental status o Mood, affect appropriate o Orientation to person, place, time  . judgment and insight appear intact  I have personally reviewed the following:   Today's Data  . Vitals, Lipid profile, CBC, CMP  Imaging  . CTA chest: No PE. The previously noted pulmonary nodules are not optimally assessed on this examination due to motion artifact. Follow up examination is recommended in 03/2021 for definitive evaluation.  Cardiology Data  . EKG . Echocardiogram  Scheduled Meds: . aspirin  81 mg Oral Daily  . atorvastatin  40 mg Oral Daily  . enoxaparin (LOVENOX) injection  60 mg Subcutaneous QHS  . metoprolol tartrate  25 mg Oral BID   Continuous Infusions:  Principal Problem:   Chest pain, rule out acute myocardial infarction Active Problems:   Syncope   DOE (dyspnea on exertion)   Pulmonary nodules  LOS: 0 days   A & P  CP: MIRO by EKG and enzyme criteria. She is being monitored on telemetry. Lipid panel demonstrated LDL is 106. HDL 36. Cardiology consulted. Plan is for Coronary CTA in the morning.  Syncope: Echocardiogram has been performed. It demonstrates an EF of 60-65% with normal LV function and no regional wall motion abnormalities. LV diastolic parameters were normal. RV systolic function is normal. Cardiology consulted.  DM II: Hemoglobin A1c is 6.4. The patient is on no antihyperglycemic medications at home. She is currently  receiving FSBS and SSI.   Pulmonary nodule(s): Seen on previous CT. Not evaluated on todays CT due to motion artifact. Radiologist recommends repeat CT in April 2022.  I have seen and examined this patient myself. I have spent 35 minutes in her evaluation and care. This patient was admitted to observation status by my colleague ealier this morning.  DVT prophylaxis: Lovenox Code Status: Full Family Communication: Family at bedside Disposition Plan:  Status is: Observation  The patient remains OBS appropriate and will d/c before 2 midnights.  Dispo: The patient is from: Home              Anticipated d/c is to: Home              Anticipated d/c date is: 1 day              Patient currently is not medically stable to d/c.   [name], DO [facility] Direct contact: see [email or web address]  7PM-7AM contact night coverage as above 10/14/2020, 2:49 PM  LOS: 0 days

## 2020-10-15 ENCOUNTER — Telehealth: Payer: Self-pay | Admitting: *Deleted

## 2020-10-15 ENCOUNTER — Other Ambulatory Visit: Payer: Self-pay | Admitting: Medical

## 2020-10-15 ENCOUNTER — Observation Stay (HOSPITAL_COMMUNITY): Payer: 59

## 2020-10-15 ENCOUNTER — Encounter (HOSPITAL_COMMUNITY): Payer: Self-pay | Admitting: Internal Medicine

## 2020-10-15 ENCOUNTER — Inpatient Hospital Stay: Admission: RE | Admit: 2020-10-15 | Payer: 59 | Source: Ambulatory Visit

## 2020-10-15 DIAGNOSIS — R079 Chest pain, unspecified: Secondary | ICD-10-CM

## 2020-10-15 DIAGNOSIS — R55 Syncope and collapse: Secondary | ICD-10-CM | POA: Diagnosis not present

## 2020-10-15 LAB — CBC WITH DIFFERENTIAL/PLATELET
Abs Immature Granulocytes: 0.01 10*3/uL (ref 0.00–0.07)
Basophils Absolute: 0 10*3/uL (ref 0.0–0.1)
Basophils Relative: 0 %
Eosinophils Absolute: 0.1 10*3/uL (ref 0.0–0.5)
Eosinophils Relative: 2 %
HCT: 33.9 % — ABNORMAL LOW (ref 36.0–46.0)
Hemoglobin: 10.1 g/dL — ABNORMAL LOW (ref 12.0–15.0)
Immature Granulocytes: 0 %
Lymphocytes Relative: 35 %
Lymphs Abs: 1.7 10*3/uL (ref 0.7–4.0)
MCH: 25.3 pg — ABNORMAL LOW (ref 26.0–34.0)
MCHC: 29.8 g/dL — ABNORMAL LOW (ref 30.0–36.0)
MCV: 84.8 fL (ref 80.0–100.0)
Monocytes Absolute: 0.4 10*3/uL (ref 0.1–1.0)
Monocytes Relative: 9 %
Neutro Abs: 2.6 10*3/uL (ref 1.7–7.7)
Neutrophils Relative %: 54 %
Platelets: 240 10*3/uL (ref 150–400)
RBC: 4 MIL/uL (ref 3.87–5.11)
RDW: 16.5 % — ABNORMAL HIGH (ref 11.5–15.5)
WBC: 4.9 10*3/uL (ref 4.0–10.5)
nRBC: 0 % (ref 0.0–0.2)

## 2020-10-15 LAB — BASIC METABOLIC PANEL
Anion gap: 5 (ref 5–15)
BUN: 9 mg/dL (ref 8–23)
CO2: 28 mmol/L (ref 22–32)
Calcium: 8.8 mg/dL — ABNORMAL LOW (ref 8.9–10.3)
Chloride: 103 mmol/L (ref 98–111)
Creatinine, Ser: 0.77 mg/dL (ref 0.44–1.00)
GFR, Estimated: >60 mL/min (ref >60)
Glucose, Bld: 133 mg/dL — ABNORMAL HIGH (ref 70–99)
Potassium: 4.3 mmol/L (ref 3.5–5.1)
Sodium: 136 mmol/L (ref 135–145)

## 2020-10-15 MED ORDER — METOPROLOL TARTRATE 5 MG/5ML IV SOLN
INTRAVENOUS | Status: AC
  Filled 2020-10-15: qty 5

## 2020-10-15 MED ORDER — LISINOPRIL 10 MG PO TABS
10.0000 mg | ORAL_TABLET | Freq: Every day | ORAL | 0 refills | Status: DC
Start: 2020-10-16 — End: 2021-05-03

## 2020-10-15 MED ORDER — NITROGLYCERIN 0.4 MG SL SUBL
SUBLINGUAL_TABLET | SUBLINGUAL | Status: AC
  Filled 2020-10-15: qty 2

## 2020-10-15 MED ORDER — ATORVASTATIN CALCIUM 40 MG PO TABS
40.0000 mg | ORAL_TABLET | Freq: Every day | ORAL | 0 refills | Status: DC
Start: 2020-10-16 — End: 2020-10-23

## 2020-10-15 MED ORDER — IOHEXOL 350 MG/ML SOLN
80.0000 mL | Freq: Once | INTRAVENOUS | Status: AC | PRN
  Administered 2020-10-15: 80 mL via INTRAVENOUS

## 2020-10-15 MED ORDER — LISINOPRIL 10 MG PO TABS
10.0000 mg | ORAL_TABLET | Freq: Every day | ORAL | Status: DC
  Administered 2020-10-15: 10 mg via ORAL
  Filled 2020-10-15: qty 1

## 2020-10-15 NOTE — Progress Notes (Signed)
Notified by CCMD, pt HR 39 nonsustained, back in 60s.  Pt awakened from sleep with no c/o.  Noted pt sat 89% occasionally while sleeping, 100% when awake.  Pt denied having sleep study in hx or OSA, pt stated she does snore "hard at times".  Lungs clear to auscultation O2 applied at 2 L per B and E, noted on strip review nonconducted PACs vs wenkebach.  Will continue to monitor pt closely and save strips in chart.

## 2020-10-15 NOTE — Telephone Encounter (Signed)
-----   Message from Cadence David Stall, PA-C sent at 10/15/2020  2:12 PM EST ----- Regarding: heart monitor Patient needs 14 day live zio for syncope. Please call and arrange. Order has been placed. Thanks!

## 2020-10-15 NOTE — Discharge Summary (Signed)
Physician Discharge Summary  Molly Flores ZOX:[id number] DOB: 11/07/1956 DOA: 10/13/2020  PCP: Adrienne Mocha, PA  Admit date: 10/13/2020 Discharge date: 10/15/2020  Recommendations for Outpatient Follow-up:  1. Discharge to home. 2. Follow up with PCP in 7-10 days. 3. Follow up with cardiology as directed. 4. Heart monitor as arranged by cardiology. 5. Pt will need follow up CT chest in 03/2021 to follow pulmonary nodules.  Discharge Diagnoses: Principal diagnosis is #1 1. Syncope due to orthostasis and vasovagal reflex 2. Chest pain (non-cardiac), MIRO, negative Coronary CTA 3. DM II 4. Pulmonary nodules  Discharge Condition: Fair  Disposition: Home  Diet recommendation: Heart healthy/Modified carbohydrates  Filed Weights   10/14/20 0200 10/15/20 0410  Weight: 126.4 kg 127.3 kg    History of present illness:  Molly Flores is a 64 y.o. female with medical history significant of HTN, obesity.  Pt has been having DOE for some months now.  Seeing pulmonology as outpt though from Dr. Isaiah Serge 06/07/20 note PFTs didn't sound very impressive.  For the past 1-2 days has had increased DOE, today had some chest pressure as well, located in central of chest, pressure quality.  While walking at store she had sudden onset of syncope.  No warning that she was going to pass out.  Larey Seat forward but didn't hit head.  Continues to have some chest discomfort and SOB.  No fever, no cough, no abd pain, no leg swelling or pain.   ED Course: CT neg for PE.  Trop neg X2.  BNP nl.  Patient takes a baby ASA daily but otherwise says shes not on any chronic medications at home currently.  Hospital Course: [facility] was consulted to admit the patient for further evaluation and treatment. She has been ruled out for MI by EKG and enzyme criteria. Echocardiogram has been ordered and cardiology is consulted.   Cardiology has evaluated the patient. Coronary CTA was performed and was  demonstrated a coronary calcium score of 0 indicating 0 percentile for age and sex matched control. Normal coronary origin with right dominance. CAD-RADS (0%). No evidence od for Echocardiogram demonstrated an EF of 60-65% with normal LV function and no regional wall motion abnormalities. LV diastolic parameters were normal. RV systolic function is normal.   The patient will be set up with cardiac monitor by cardiology. She has been cleared for discharge to home.  Today's assessment: S: The patient is resting comfortably. No new complaints. O: Vitals:  Vitals:   10/15/20 0410 10/15/20 1105  BP: 127/76 (!) 160/84  Pulse: 66 60  Resp: 15 20  Temp: 97.9 F (36.6 C) 97.9 F (36.6 C)  SpO2: 100%    Exam:  Constitutional:   The patient is awake, alert, and oriented x 3. No acute distress. Respiratory:   No increased work of breathing.  No wheezes, rales, or rhonchi  No tactile fremitus Cardiovascular:   Regular rate and rhythm  No murmurs, ectopy, or gallups.  No lateral PMI. No thrills. Abdomen:   Abdomen is soft, non-tender, non-distended  No hernias, masses, or organomegaly  Normoactive bowel sounds.  Musculoskeletal:   No cyanosis, clubbing, or edema Skin:   No rashes, lesions, ulcers  palpation of skin: no induration or nodules Neurologic:   CN 2-12 intact  Sensation all 4 extremities intact Psychiatric:   Mental status ? Mood, affect appropriate ? Orientation to person, place, time   judgment and insight appear intact  Discharge Instructions  Discharge Instructions    Activity as  tolerated - No restrictions   Complete by: As directed    Call MD for:  difficulty breathing, headache or visual disturbances   Complete by: As directed    Call MD for:  extreme fatigue   Complete by: As directed    Call MD for:  persistant dizziness or light-headedness   Complete by: As directed    Call MD for:  severe uncontrolled pain   Complete by: As  directed    Diet - low sodium heart healthy   Complete by: As directed    Discharge instructions   Complete by: As directed    Discharge to home. Follow up with PCP in 7-10 days. Follow up with cardiology as directed. Heart monitor as arranged by cardiology.   Increase activity slowly   Complete by: As directed      Allergies as of 10/15/2020      Reactions   Oxycodone Other (See Comments)   Hallucinations      Medication List    STOP taking these medications   ibuprofen 200 MG tablet Commonly known as: ADVIL     TAKE these medications   aspirin 81 MG chewable tablet Chew 81 mg by mouth daily.   atorvastatin 40 MG tablet Commonly known as: LIPITOR Take 1 tablet (40 mg total) by mouth daily. Start taking on: October 16, 2020   lisinopril 10 MG tablet Commonly known as: ZESTRIL Take 1 tablet (10 mg total) by mouth daily. Start taking on: October 16, 2020      Allergies  Allergen Reactions  . Oxycodone Other (See Comments)    Hallucinations     The results of significant diagnostics from this hospitalization (including imaging, microbiology, ancillary and laboratory) are listed below for reference.    Significant Diagnostic Studies: CT ANGIO CHEST PE W OR WO CONTRAST  Result Date: 10/14/2020 CLINICAL DATA:  Syncope, fall, dyspnea, chest pressure EXAM: CT ANGIOGRAPHY CHEST WITH CONTRAST TECHNIQUE: Multidetector CT imaging of the chest was performed using the standard protocol during bolus administration of intravenous contrast. Multiplanar CT image reconstructions and MIPs were obtained to evaluate the vascular anatomy. CONTRAST:  80mL OMNIPAQUE IOHEXOL 350 MG/ML SOLN COMPARISON:  03/16/2020 FINDINGS: Cardiovascular: Satisfactory opacification of the pulmonary arteries to the segmental level. No evidence of pulmonary embolism. Normal heart size. No pericardial effusion. The thoracic aorta is unremarkable save for bovine arch anatomy. Mediastinum/Nodes: No  enlarged mediastinal, hilar, or axillary lymph nodes. Thyroid gland, trachea, and esophagus demonstrate no significant findings. Lungs/Pleura: Evaluation of the pulmonary parenchyma is slightly limited by motion artifact. 7 mm noncalcified pulmonary nodule within the lingula may have enlarged slightly in the interval since prior examination, this may be in part artifactual and related to motion artifact. Intrapulmonary lymph node along the minor fissure is not well visualized. Previously noted ground-glass pulmonary infiltrate is not well appreciated on the current examination. No new focal pulmonary infiltrates or nodules. No pneumothorax or pleural effusion. Central airways are widely patent. Upper Abdomen: No acute abnormality. Musculoskeletal: No acute bone abnormality Review of the MIP images confirms the above findings. IMPRESSION: No pulmonary embolism. No radiographic explanation for the patient's reported symptoms. Previously noted pulmonary nodules are not optimally assessed on this examination due to motion artifact. Follow-up evaluation, as outlined on prior report in 12 months from that examination (approximately 03/2021) is recommended for definitive evaluation. Aortic Atherosclerosis (ICD10-I70.0). Electronically Signed   By: Helyn NumbersAshesh  Parikh MD   On: 10/14/2020 00:25   CT CORONARY MORPH W/CTA COR W/SCORE  W/CA W/CM &/OR WO/CM  Addendum Date: 10/15/2020   ADDENDUM REPORT: 10/15/2020 12:49 CLINICAL DATA:  64 year old female with h/o hypertension, hyperlipidemia, CVA who was admitted with chest pain and syncope. EXAM: Cardiac/Coronary  CTA TECHNIQUE: The patient was scanned on a [employer]. FINDINGS: A 100 kV prospective scan was triggered in the descending thoracic aorta at 111 HU's. Axial non-contrast 3 mm slices were carried out through the heart. The data set was analyzed on a dedicated work station and scored using the Agatson method. Gantry rotation speed was 250 msecs and  collimation was .6 mm. 50 mg of PO Metoprolol, 5 mg of iv Metoprolol and 0.8 mg of sl NTG was given. The 3D data set was reconstructed in 5% intervals of the 67-82 % of the R-R cycle. Diastolic phases were analyzed on a dedicated work station using MPR, MIP and VRT modes. The patient received 80 cc of contrast. Aorta:  Normal size.  No calcifications.  No dissection. Aortic Valve:  Trileaflet.  Mild calcifications. Coronary Arteries:  Normal coronary origin.  Right dominance. RCA is a large dominant artery that gives rise to PDA and PLA. There is no plaque. Left main is a large artery that gives rise to LAD and LCX arteries. LAD is a large vessel that has no plaque. LCX is a non-dominant artery that gives rise to two OM branches. There is no plaque. Other findings: Normal pulmonary vein drainage into the left atrium. Normal left atrial appendage without a thrombus. Normal size of the pulmonary artery. IMPRESSION: 1. Coronary calcium score of 0. This was 0 percentile for age and sex matched control. 2. Normal coronary origin with right dominance. 3. CAD-RADS 0. No evidence of CAD (0%). Consider non-atherosclerotic causes of chest pain. Electronically Signed   By: Tobias Alexander   On: 10/15/2020 12:49   Result Date: 10/15/2020 EXAM: OVER-READ INTERPRETATION  CT CHEST The following report is an over-read performed by radiologist Dr. Trudie Reed of [facility] on 10/15/2020. This over-read does not include interpretation of cardiac or coronary anatomy or pathology. The coronary calcium score/coronary CTA interpretation by the cardiologist is attached. COMPARISON:  Chest CT 10/13/2020. FINDINGS: 7 x 5 mm (mean diameter of 6 mm) ground-glass attenuation nodule in the right upper lobe (axial image 10 of series 12). 4 mm solid appearing nodule in the posterior aspect of the left upper lobe (axial image 21 of series 12). Within the visualized portions of the thorax there are no other larger more  suspicious appearing pulmonary nodules or masses, there is no acute consolidative airspace disease, no pleural effusions, no pneumothorax and no lymphadenopathy. Visualized portions of the upper abdomen are unremarkable. There are no aggressive appearing lytic or blastic lesions noted in the visualized portions of the skeleton. IMPRESSION: 1. 4 mm left upper lobe nodule and ground-glass attenuation nodule in the right upper lobe with a mean diameter of 6 mm, nonspecific, but similar to prior examinations. Follow-up noncontrast chest CT is recommended in 12 months to ensure stability. This recommendation follows the consensus statement: Guidelines for Management of Incidental Pulmonary Nodules Detected on CT Images: From the Fleischner Society 2017; Radiology 2017; 284:228-243. Electronically Signed: By: Trudie Reed M.D. On: 10/15/2020 11:03   DG Chest Port 1 View  Result Date: 10/13/2020 CLINICAL DATA:  64 year old female with shortness of breath. EXAM: PORTABLE CHEST 1 VIEW COMPARISON:  Chest CT dated 03/16/2020. FINDINGS: There is mild cardiomegaly with mild vascular congestion. No focal consolidation, pleural effusion or pneumothorax.  No acute osseous pathology. IMPRESSION: Mild cardiomegaly with mild vascular congestion. No focal consolidation. Electronically Signed   By: Elgie Collard M.D.   On: 10/13/2020 22:13   ECHOCARDIOGRAM COMPLETE  Result Date: 10/14/2020    ECHOCARDIOGRAM REPORT   Patient Name:   REALITY DEJONGE Date of Exam: 10/14/2020 Medical Rec #:  [id number]    Height:       65.0 in Accession #:    [id number]   Weight:       278.7 lb Date of Birth:  [birth year]    BSA:          2.278 m Patient Age:    64 years     BP:           164/77 mmHg Patient Gender: F            HR:           76 bpm. Exam Location:  Inpatient Procedure: 2D Echo, Cardiac Doppler and Color Doppler Indications:    R55 Syncope; R07.9* Chest pain, unspecified  History:        Patient has no prior history of  Echocardiogram examinations.                 Risk Factors:Hypertension and Dyslipidemia.  Sonographer:    Elmarie Shiley Dance Referring Phys: [id number] JARED M GARDNER IMPRESSIONS  1. Left ventricular ejection fraction, by estimation, is 60 to 65%. The left ventricle has normal function. The left ventricle has no regional wall motion abnormalities. Left ventricular diastolic parameters were normal.  2. Right ventricular systolic function is normal. The right ventricular size is normal.  3. The mitral valve is normal in structure. No evidence of mitral valve regurgitation. No evidence of mitral stenosis.  4. The aortic valve is tricuspid. Aortic valve regurgitation is not visualized. No aortic stenosis is present.  5. The inferior vena cava is normal in size with greater than 50% respiratory variability, suggesting right atrial pressure of 3 mmHg. FINDINGS  Left Ventricle: Left ventricular ejection fraction, by estimation, is 60 to 65%. The left ventricle has normal function. The left ventricle has no regional wall motion abnormalities. The left ventricular internal cavity size was normal in size. There is  no left ventricular hypertrophy. Left ventricular diastolic parameters were normal. Right Ventricle: The right ventricular size is normal.Right ventricular systolic function is normal. Left Atrium: Left atrial size was normal in size. Right Atrium: Right atrial size was normal in size. Pericardium: There is no evidence of pericardial effusion. Mitral Valve: The mitral valve is normal in structure. No evidence of mitral valve regurgitation. No evidence of mitral valve stenosis. Tricuspid Valve: The tricuspid valve is normal in structure. Tricuspid valve regurgitation is trivial. No evidence of tricuspid stenosis. Aortic Valve: The aortic valve is tricuspid. Aortic valve regurgitation is not visualized. No aortic stenosis is present. Pulmonic Valve: The pulmonic valve was normal in structure. Pulmonic valve regurgitation is  not visualized. No evidence of pulmonic stenosis. Aorta: The aortic root is normal in size and structure. Venous: The inferior vena cava is normal in size with greater than 50% respiratory variability, suggesting right atrial pressure of 3 mmHg.  LEFT VENTRICLE PLAX 2D LVIDd:         3.96 cm Diastology LVIDs:         3.05 cm LV e' medial:    8.05 cm/s LV PW:         1.03 cm LV E/e' medial:  11.8 LV IVS:  0.86 cm LV e' lateral:   10.80 cm/s                        LV E/e' lateral: 8.8  RIGHT VENTRICLE             IVC RV Basal diam:  3.16 cm     IVC diam: 1.77 cm RV Mid diam:    2.00 cm RV S prime:     12.20 cm/s TAPSE (M-mode): 2.0 cm LEFT ATRIUM             Index       RIGHT ATRIUM           Index LA diam:        4.00 cm 1.76 cm/m  RA Area:     17.80 cm LA Vol (A2C):   48.6 ml 21.34 ml/m RA Volume:   54.00 ml  23.71 ml/m LA Vol (A4C):   57.0 ml 25.03 ml/m LA Biplane Vol: 53.4 ml 23.45 ml/m  AORTIC VALVE LVOT Vmax:   114.00 cm/s LVOT Vmean:  68.100 cm/s LVOT VTI:    0.244 m  AORTA Ao Asc diam: 3.00 cm MITRAL VALVE MV Area (PHT): 2.91 cm    SHUNTS MV Decel Time: 261 msec    Systemic VTI: 0.24 m MV E velocity: 94.60 cm/s MV A velocity: 80.50 cm/s MV E/A ratio:  1.18 Olga Millers MD Electronically signed by Olga Millers MD Signature Date/Time: 10/14/2020/10:43:36 AM    Final     Microbiology: Recent Results (from the past 240 hour(s))  Respiratory Panel by RT PCR (Flu A&B, Covid) - Nasopharyngeal Swab     Status: None   Collection Time: 10/13/20 11:00 PM   Specimen: Nasopharyngeal Swab  Result Value Ref Range Status   SARS Coronavirus 2 by RT PCR NEGATIVE NEGATIVE Final    Comment: (NOTE) SARS-CoV-2 target nucleic acids are NOT DETECTED.  The SARS-CoV-2 RNA is generally detectable in upper respiratoy specimens during the acute phase of infection. The lowest concentration of SARS-CoV-2 viral copies this assay can detect is 131 copies/mL. A negative result does not preclude  SARS-Cov-2 infection and should not be used as the sole basis for treatment or other patient management decisions. A negative result may occur with  improper specimen collection/handling, submission of specimen other than nasopharyngeal swab, presence of viral mutation(s) within the areas targeted by this assay, and inadequate number of viral copies (<131 copies/mL). A negative result must be combined with clinical observations, patient history, and epidemiological information. The expected result is Negative.  Fact Sheet for Patients:  https://www.moore.com/  Fact Sheet for Healthcare Providers:  https://www.young.biz/  This test is no t yet approved or cleared by the [place] FDA and  has been authorized for detection and/or diagnosis of SARS-CoV-2 by FDA under an Emergency Use Authorization (EUA). This EUA will remain  in effect (meaning this test can be used) for the duration of the COVID-19 declaration under Section 564(b)(1) of the Act, 21 U.S.C. section 360bbb-3(b)(1), unless the authorization is terminated or revoked sooner.     Influenza A by PCR NEGATIVE NEGATIVE Final   Influenza B by PCR NEGATIVE NEGATIVE Final    Comment: (NOTE) The Xpert Xpress SARS-CoV-2/FLU/RSV assay is intended as an aid in  the diagnosis of influenza from Nasopharyngeal swab specimens and  should not be used as a sole basis for treatment. Nasal washings and  aspirates are unacceptable for Xpert Xpress SARS-CoV-2/FLU/RSV  testing.  Fact Sheet for Patients: https://www.moore.com/  Fact Sheet for Healthcare Providers: https://www.young.biz/  This test is not yet approved or cleared by the [place] FDA and  has been authorized for detection and/or diagnosis of SARS-CoV-2 by  FDA under an Emergency Use Authorization (EUA). This EUA will remain  in effect (meaning this test can be used) for the duration of the   Covid-19 declaration under Section 564(b)(1) of the Act, 21  U.S.C. section 360bbb-3(b)(1), unless the authorization is  terminated or revoked. Performed at [facility], [address] [place], [place] [address]      Labs: Basic Metabolic Panel: Recent Labs  Lab 10/13/20 2144 10/15/20 0345  NA 138 136  K 4.2 4.3  CL 103 103  CO2 25 28  GLUCOSE 146* 133*  BUN 8 9  CREATININE 0.87 0.77  CALCIUM 9.5 8.8*   Liver Function Tests: Recent Labs  Lab 10/13/20 2144  AST 19  ALT 13  ALKPHOS 61  BILITOT 0.4  PROT 7.4  ALBUMIN 3.6   No results for input(s): LIPASE, AMYLASE in the last 168 hours. No results for input(s): AMMONIA in the last 168 hours. CBC: Recent Labs  Lab 10/13/20 2144 10/15/20 0345  WBC 6.6 4.9  NEUTROABS  --  2.6  HGB 11.1* 10.1*  HCT 38.0 33.9*  MCV 83.5 84.8  PLT 281 240   Cardiac Enzymes: No results for input(s): CKTOTAL, CKMB, CKMBINDEX, TROPONINI in the last 168 hours. BNP: BNP (last 3 results) Recent Labs    10/13/20 2144  BNP 36.4    ProBNP (last 3 results) No results for input(s): PROBNP in the last 8760 hours.  CBG: No results for input(s): GLUCAP in the last 168 hours.  Principal Problem:   Chest pain, rule out acute myocardial infarction Active Problems:   Syncope   DOE (dyspnea on exertion)   Pulmonary nodules   Time coordinating discharge: 38 minutes.  Signed:        Lakitha Gordy, DO [facility]  10/15/2020, 3:30 PM

## 2020-10-15 NOTE — Telephone Encounter (Signed)
Received message from [facility] PA at the [facility].RN spoke to [facility] Holter  technician - for instructions  instruction given   - order placed by Cadence FurthPA.  Monitor  will be mailed to patient with instructions once discharge per tech  patient will see Dr Mayford Knife per today's  hospital note,

## 2020-10-15 NOTE — Progress Notes (Addendum)
Progress Note  Patient Name: Molly Flores Date of Encounter: 10/15/2020  [facility] Cardiologist: Armanda Magic, MD   Subjective   Patient came in with syncope, also had some CP. She states CP better after after O2 applied last night. Patient was planning on OP sleep study. Plan for coronary CTA to evaluate for CAD. Orthostatics negative.   Overnight tele showed possible transient second degree AV block, heart rate in the upper 40-50s. Patient was sleeping and asymptomatic. No recurrence noted.   Inpatient Medications    Scheduled Meds: . aspirin  81 mg Oral Daily  . atorvastatin  40 mg Oral Daily  . enoxaparin (LOVENOX) injection  60 mg Subcutaneous QHS  . metoprolol tartrate  50 mg Oral BID   Continuous Infusions:  PRN Meds: acetaminophen, ondansetron (ZOFRAN) IV   Vital Signs    Vitals:   10/14/20 1653 10/14/20 2026 10/15/20 0035 10/15/20 0410  BP: (!) 158/84 (!) 154/84 (!) 148/82 127/76  Pulse: 78 69 66 66  Resp: 20 18 20 15   Temp: 98.3 F (36.8 C) 98.5 F (36.9 C) 98.1 F (36.7 C) 97.9 F (36.6 C)  TempSrc: Oral Oral Oral Oral  SpO2: 99% 100% 100% 100%  Weight:    127.3 kg  Height:        Intake/Output Summary (Last 24 hours) at 10/15/2020 0723 Last data filed at 10/14/2020 2200 Gross per 24 hour  Intake 240 ml  Output --  Net 240 ml   Last 3 Weights 10/15/2020 10/14/2020 04/12/2020  Weight (lbs) 280 lb 10.3 oz 278 lb 10.6 oz 278 lb 9.6 oz  Weight (kg) 127.3 kg 126.4 kg 126.372 kg      Telemetry    SR HR upper 40s to 60s, transient second degree AV block with heart rates int he 50s- Personally Reviewed  ECG    No new - Personally Reviewed  Physical Exam   GEN: No acute distress.   Neck: No JVD Cardiac: RRR, no murmurs, rubs, or gallops.  Respiratory: Clear to auscultation bilaterally. GI: Soft, nontender, non-distended  MS: No edema; No deformity. Neuro:  Nonfocal  Psych: Normal affect   Labs    High Sensitivity Troponin:    Recent Labs  Lab 10/13/20 2144 10/13/20 2350  TROPONINIHS 4 4      Chemistry Recent Labs  Lab 10/13/20 2144 10/15/20 0345  NA 138 136  K 4.2 4.3  CL 103 103  CO2 25 28  GLUCOSE 146* 133*  BUN 8 9  CREATININE 0.87 0.77  CALCIUM 9.5 8.8*  PROT 7.4  --   ALBUMIN 3.6  --   AST 19  --   ALT 13  --   ALKPHOS 61  --   BILITOT 0.4  --   GFRNONAA >60 >60  ANIONGAP 10 5     Hematology Recent Labs  Lab 10/13/20 2144 10/15/20 0345  WBC 6.6 4.9  RBC 4.55 4.00  HGB 11.1* 10.1*  HCT 38.0 33.9*  MCV 83.5 84.8  MCH 24.4* 25.3*  MCHC 29.2* 29.8*  RDW 16.3* 16.5*  PLT 281 240    BNP Recent Labs  Lab 10/13/20 2144  BNP 36.4     DDimer No results for input(s): DDIMER in the last 168 hours.   Radiology    CT ANGIO CHEST PE W OR WO CONTRAST  Result Date: 10/14/2020 CLINICAL DATA:  Syncope, fall, dyspnea, chest pressure EXAM: CT ANGIOGRAPHY CHEST WITH CONTRAST TECHNIQUE: Multidetector CT imaging of the chest was performed using  the standard protocol during bolus administration of intravenous contrast. Multiplanar CT image reconstructions and MIPs were obtained to evaluate the vascular anatomy. CONTRAST:  80mL OMNIPAQUE IOHEXOL 350 MG/ML SOLN COMPARISON:  03/16/2020 FINDINGS: Cardiovascular: Satisfactory opacification of the pulmonary arteries to the segmental level. No evidence of pulmonary embolism. Normal heart size. No pericardial effusion. The thoracic aorta is unremarkable save for bovine arch anatomy. Mediastinum/Nodes: No enlarged mediastinal, hilar, or axillary lymph nodes. Thyroid gland, trachea, and esophagus demonstrate no significant findings. Lungs/Pleura: Evaluation of the pulmonary parenchyma is slightly limited by motion artifact. 7 mm noncalcified pulmonary nodule within the lingula may have enlarged slightly in the interval since prior examination, this may be in part artifactual and related to motion artifact. Intrapulmonary lymph node along the minor fissure  is not well visualized. Previously noted ground-glass pulmonary infiltrate is not well appreciated on the current examination. No new focal pulmonary infiltrates or nodules. No pneumothorax or pleural effusion. Central airways are widely patent. Upper Abdomen: No acute abnormality. Musculoskeletal: No acute bone abnormality Review of the MIP images confirms the above findings. IMPRESSION: No pulmonary embolism. No radiographic explanation for the patient's reported symptoms. Previously noted pulmonary nodules are not optimally assessed on this examination due to motion artifact. Follow-up evaluation, as outlined on prior report in 12 months from that examination (approximately 03/2021) is recommended for definitive evaluation. Aortic Atherosclerosis (ICD10-I70.0). Electronically Signed   By: Helyn NumbersAshesh  Parikh MD   On: 10/14/2020 00:25   DG Chest Port 1 View  Result Date: 10/13/2020 CLINICAL DATA:  64 year old female with shortness of breath. EXAM: PORTABLE CHEST 1 VIEW COMPARISON:  Chest CT dated 03/16/2020. FINDINGS: There is mild cardiomegaly with mild vascular congestion. No focal consolidation, pleural effusion or pneumothorax. No acute osseous pathology. IMPRESSION: Mild cardiomegaly with mild vascular congestion. No focal consolidation. Electronically Signed   By: Elgie CollardArash  Radparvar M.D.   On: 10/13/2020 22:13   ECHOCARDIOGRAM COMPLETE  Result Date: 10/14/2020    ECHOCARDIOGRAM REPORT   Patient Name:   Molly Flores Date of Exam: 10/14/2020 Medical Rec #:  [id number]    Height:       65.0 in Accession #:    [id number]   Weight:       278.7 lb Date of Birth:  05/25/1956    BSA:          2.278 m Patient Age:    64 years     BP:           164/77 mmHg Patient Gender: F            HR:           76 bpm. Exam Location:  Inpatient Procedure: 2D Echo, Cardiac Doppler and Color Doppler Indications:    R55 Syncope; R07.9* Chest pain, unspecified  History:        Patient has no prior history of Echocardiogram  examinations.                 Risk Factors:Hypertension and Dyslipidemia.  Sonographer:    Elmarie Shileyiffany Dance Referring Phys: [id number] JARED M GARDNER IMPRESSIONS  1. Left ventricular ejection fraction, by estimation, is 60 to 65%. The left ventricle has normal function. The left ventricle has no regional wall motion abnormalities. Left ventricular diastolic parameters were normal.  2. Right ventricular systolic function is normal. The right ventricular size is normal.  3. The mitral valve is normal in structure. No evidence of mitral valve regurgitation. No evidence of mitral stenosis.  4. The aortic  valve is tricuspid. Aortic valve regurgitation is not visualized. No aortic stenosis is present.  5. The inferior vena cava is normal in size with greater than 50% respiratory variability, suggesting right atrial pressure of 3 mmHg. FINDINGS  Left Ventricle: Left ventricular ejection fraction, by estimation, is 60 to 65%. The left ventricle has normal function. The left ventricle has no regional wall motion abnormalities. The left ventricular internal cavity size was normal in size. There is  no left ventricular hypertrophy. Left ventricular diastolic parameters were normal. Right Ventricle: The right ventricular size is normal.Right ventricular systolic function is normal. Left Atrium: Left atrial size was normal in size. Right Atrium: Right atrial size was normal in size. Pericardium: There is no evidence of pericardial effusion. Mitral Valve: The mitral valve is normal in structure. No evidence of mitral valve regurgitation. No evidence of mitral valve stenosis. Tricuspid Valve: The tricuspid valve is normal in structure. Tricuspid valve regurgitation is trivial. No evidence of tricuspid stenosis. Aortic Valve: The aortic valve is tricuspid. Aortic valve regurgitation is not visualized. No aortic stenosis is present. Pulmonic Valve: The pulmonic valve was normal in structure. Pulmonic valve regurgitation is not visualized.  No evidence of pulmonic stenosis. Aorta: The aortic root is normal in size and structure. Venous: The inferior vena cava is normal in size with greater than 50% respiratory variability, suggesting right atrial pressure of 3 mmHg.  LEFT VENTRICLE PLAX 2D LVIDd:         3.96 cm Diastology LVIDs:         3.05 cm LV e' medial:    8.05 cm/s LV PW:         1.03 cm LV E/e' medial:  11.8 LV IVS:        0.86 cm LV e' lateral:   10.80 cm/s                        LV E/e' lateral: 8.8  RIGHT VENTRICLE             IVC RV Basal diam:  3.16 cm     IVC diam: 1.77 cm RV Mid diam:    2.00 cm RV S prime:     12.20 cm/s TAPSE (M-mode): 2.0 cm LEFT ATRIUM             Index       RIGHT ATRIUM           Index LA diam:        4.00 cm 1.76 cm/m  RA Area:     17.80 cm LA Vol (A2C):   48.6 ml 21.34 ml/m RA Volume:   54.00 ml  23.71 ml/m LA Vol (A4C):   57.0 ml 25.03 ml/m LA Biplane Vol: 53.4 ml 23.45 ml/m  AORTIC VALVE LVOT Vmax:   114.00 cm/s LVOT Vmean:  68.100 cm/s LVOT VTI:    0.244 m  AORTA Ao Asc diam: 3.00 cm MITRAL VALVE MV Area (PHT): 2.91 cm    SHUNTS MV Decel Time: 261 msec    Systemic VTI: 0.24 m MV E velocity: 94.60 cm/s MV A velocity: 80.50 cm/s MV E/A ratio:  1.18 Olga Millers MD Electronically signed by Olga Millers MD Signature Date/Time: 10/14/2020/10:43:36 AM    Final     Cardiac Studies   Echo 10/14/20 1. Left ventricular ejection fraction, by estimation, is 60 to 65%. The  left ventricle has normal function. The left ventricle has no regional  wall motion abnormalities. Left  ventricular diastolic parameters were  normal.  2. Right ventricular systolic function is normal. The right ventricular  size is normal.  3. The mitral valve is normal in structure. No evidence of mitral valve  regurgitation. No evidence of mitral stenosis.  4. The aortic valve is tricuspid. Aortic valve regurgitation is not  visualized. No aortic stenosis is present.  5. The inferior vena cava is normal in size with  greater than 50%  respiratory variability, suggesting right atrial pressure of 3 mmHg.   Patient Profile     64 y.o. female with hx of HTN, HLD, prior stroked, pulmonary nodule and recent evaluation by pulmonology for ongoing shortness of breath syndrome viral syndrome in Feb 2020 who is being seen for chest pain and syncope.  Assessment & Plan    Syncope - vasovagal vs orthostatic - per chart review (care everywhere) patient had unremarkable heart monitor for syncope - Normal EF on Myoview in 01/2019 - EKG unremarkable - Echo showed normal LVEF and no valvular disease - orthostatics normal - Overnight patient had possible transient second degree AV block. She was sleeping and asymptomatic. Will review with MR. Might need lower dose of BB  - Will likely need OP heart monitor  Chest pain - typical and atypical features - HS trop negative - no IV heparin with negative troponin - Echo showed normal EF with no valve disease - normal cath in 2013 - continue statin, ASA, BB - Coronary CTA today - states CP improved with O2 part of the night.   HTN  - uncontrolled and started on Metoprolol 25mg  BID - BP today 127/76  HLD - aortic atherosclerosis noted on CT. Also has diabetes - started on atorvastatin 40mg  daily   For questions or updates, please contact [facility] Please consult [email or web address] for contact info under        Signed, Cadence , PA-C  10/15/2020, 7:23 AM    Patient seen and examined and agree with Cadence David Stall, PA-C.  In brief, patient is a 64 year old female  hx of HTN, HLD, prior stroked, pulmonary nodule and recent evaluation by pulmonology for ongoing shortness of breath syndrome viral syndrome in Feb 2020 who is being seen for chest pain and syncope.   Trop negative, ECG without ischemic changes. TTE with normal EF, no WMA, no significant valvular lesions. Orthostatics negative. No significant events on tele. Awaiting CTA.  Exam: GEN: No  acute distress.   Neck: No JVD Cardiac: RRR, no murmurs, rubs, or gallops.  Respiratory: Clear to auscultation bilaterally. GI: Soft, nontender, non-distended  MS: No edema; No deformity. Neuro:  Nonfocal  Psych: Normal affect   Plan: -Work-up thus far reassuring with normal TTE, negative trops, negative orthostatics, no significant events on telemetry -Pending CTA, can likely discharge home today -Plan for out-patient sleep study -Will plan on out-patient cardiac monitor upon discharge given syncope -Orthostatic precautions -Will arrange for continued Cardiology out-patient follow-up  65, MD

## 2020-10-15 NOTE — Progress Notes (Signed)
Spoke with Dr. Mayford Knife regarding rhythm strip, hold cardiac CT and am metoprolol until seen by cardiology.

## 2020-10-16 ENCOUNTER — Encounter: Payer: Self-pay | Admitting: *Deleted

## 2020-10-16 ENCOUNTER — Other Ambulatory Visit: Payer: Self-pay

## 2020-10-16 DIAGNOSIS — R55 Syncope and collapse: Secondary | ICD-10-CM

## 2020-10-16 NOTE — Progress Notes (Signed)
I am fine with changing to what ever monitor her insurance will cover

## 2020-10-16 NOTE — Progress Notes (Signed)
Patient ID: Molly Flores, female   DOB: [birth year], 64 y.o.   MRN: [id number] [name] /ZIO patch is not in network with patients insurance, [employer] and would not be covered.   [employer] would cover a cardiac event monitor, (still live transmitting), from [employer].   Would you consider changing her order from [id number] Long term monitor-Live Telemetry to [id number] event monitor, (up to 30 days)?

## 2020-10-22 ENCOUNTER — Ambulatory Visit: Payer: 59 | Admitting: Pulmonary Disease

## 2020-10-23 ENCOUNTER — Telehealth (INDEPENDENT_AMBULATORY_CARE_PROVIDER_SITE_OTHER): Payer: 59 | Admitting: Physician Assistant

## 2020-10-23 ENCOUNTER — Encounter: Payer: Self-pay | Admitting: Physician Assistant

## 2020-10-23 ENCOUNTER — Other Ambulatory Visit: Payer: Self-pay

## 2020-10-23 ENCOUNTER — Ambulatory Visit (INDEPENDENT_AMBULATORY_CARE_PROVIDER_SITE_OTHER): Payer: 59

## 2020-10-23 VITALS — BP 143/78 | Ht 65.0 in | Wt 257.0 lb

## 2020-10-23 DIAGNOSIS — I10 Essential (primary) hypertension: Secondary | ICD-10-CM | POA: Diagnosis not present

## 2020-10-23 DIAGNOSIS — R0789 Other chest pain: Secondary | ICD-10-CM | POA: Diagnosis not present

## 2020-10-23 DIAGNOSIS — I700 Atherosclerosis of aorta: Secondary | ICD-10-CM

## 2020-10-23 DIAGNOSIS — R55 Syncope and collapse: Secondary | ICD-10-CM

## 2020-10-23 MED ORDER — AMLODIPINE BESYLATE 5 MG PO TABS: 5.0000 mg | ORAL_TABLET | Freq: Every day | ORAL | 6 refills | Status: DC

## 2020-10-23 NOTE — Patient Instructions (Addendum)
Medication Instructions:  Your physician has recommended you make the following change in your medication:   1) Start Amlodipine 5 mg, 1 tablet by mouth once a day  *If you need a refill on your cardiac medications before your next appointment, please call your pharmacy*  Lab Work: None ordered today  If you have labs (blood work) drawn today and your tests are completely normal, you will receive your results only by: Marland Kitchen MyChart Message (if you have MyChart) OR . A paper copy in the mail If you have any lab test that is abnormal or we need to change your treatment, we will call you to review the results.  Testing/Procedures: None ordered today  Follow-Up: On 12/11/2020 at 3:40PM with Armanda Magic, MD  Other Instructions Try famotidine (Pepcid) over-the-counter (either 10 or 20 mg) and take it twice a day for 2 weeks. If this improves chest symptoms, follow-up with primary care.

## 2020-10-23 NOTE — Progress Notes (Addendum)
Virtual Visit via Telephone Note   This visit type was conducted due to national recommendations for restrictions regarding the COVID-19 Pandemic (e.g. social distancing) in an effort to limit this patient's exposure and mitigate transmission in our community.  Due to her co-morbid illnesses, this patient is at least at moderate risk for complications without adequate follow up.  This format is felt to be most appropriate for this patient at this time.  The patient did not have access to video technology/had technical difficulties with video requiring transitioning to audio format only (telephone).  All issues noted in this document were discussed and addressed.  No physical exam could be performed with this format.  Please refer to the patient's chart for her  consent to telehealth for [facility].    Date:  10/23/2020   ID:  Molly Flores, DOB [birth year], MRN [id number] The patient was identified using 2 identifiers.  Patient Location: Home Provider Location: Home Office  PCP:  Adrienne Mocha, PA  Cardiologist:  Armanda Magic, MD  Electrophysiologist:  None   Evaluation Performed:  Follow-Up Visit  Chief Complaint:  Hospitalization Follow-up (Admitted with chest pain, syncope)    Patient Profile: Molly Flores is a 64 y.o. female with:  Hypertension   Hyperlipidemia  Hx of CVA/TIA   Pulmonary nodule  Syncope   Chest pain   Cardiac catheterization 10/2012 ([facility]): no CAD  Myoview ([facility]) 01/2019: no ischemia   Coronary CTA 11/21: no CAD, Ca2+ score 0  Echocardiogram 11/21: EF 60-65  Aortic atherosclerosis    Prior CV Studies:   Coronary CTA 10/15/20 IMPRESSION: 1. Coronary calcium score of 0. This was 0 percentile for age and sex matched control. 2. Normal coronary origin with right dominance. 3. CAD-RADS 0. No evidence of CAD (0%). Consider non-atherosclerotic causes of chest pain.  Echocardiogram 10/14/20 EF 60-65, no RWMA, normal RVSF   History of  Present Illness:   Molly Flores was admitted 11/13-11/15 with chest discomfort and syncope.  She had recently been evaluated by pulmonology for ongoing dyspnea with exertion.  Her breathing was worsened on the day of admission and associated with chest discomfort.  She felt near syncopal earlier in the day and ultimately had a syncopal episode at home after standing up from the table to walk into the kitchen.  CT was negative for pulmonary embolism.  High-sensitivity troponins were normal.  Orthostatic vital signs were normal.  Coronary CTA demonstrated a calcium score of 0 and no evidence of CAD.  Her EF was normal on echocardiogram.  Outpatient event monitor was arranged.  She is seen for follow-up.  Since discharge from the hospital, she continues to experience chest discomfort.  This is a fairly constant feeling without significant change.  She is not sure if she has indigestion or not.  She has had some issues with bowel obstruction in the past.  She notes fatigue and lack of energy.  She has fairly chronic dyspnea with exertion.  She has been evaluated by pulmonology in the past.  She has not had any further syncope.  She has not had orthopnea or significant leg swelling.  Past Medical History:  Diagnosis Date  . Anemia   . Borderline diabetes   . Carotid US    [facility] 01/2019: no ICA stenosis  . Dyspnea   . History of cardiac catheterization    [facility] 10/2012:  no CAD  . History of nuclear stress test    [facility] at [facility] 01/2019: no ischemia  .  History of small bowel obstruction   . History of TIA (transient ischemic attack)   . Hyperlipidemia   . Hypertension    Past Surgical History:  Procedure Laterality Date  . ABDOMINAL HYSTERECTOMY    . ABDOMINAL SURGERY    . DIAGNOSTIC LAPAROSCOPY    . LAPAROSCOPIC OOPHERECTOMY Bilateral      Current Meds  Medication Sig  . Ascorbic Acid (VITAMIN C) 500 MG CAPS Take 1 capsule by mouth daily.  Marland Kitchen aspirin 81 MG chewable tablet Chew 81 mg by mouth  daily.  . Cholecalciferol (VITAMIN D3) 1.25 MG (50000 UT) TABS Take 1 tablet by mouth daily.  . Ferrous Sulfate (IRON) 325 (65 Fe) MG TABS Take 1 tablet by mouth daily.  Marland Kitchen lisinopril (ZESTRIL) 10 MG tablet Take 1 tablet (10 mg total) by mouth daily.  . pravastatin (PRAVACHOL) 40 MG tablet Take 40 mg by mouth daily.  . vitamin B-12 (CYANOCOBALAMIN) 1000 MCG tablet Take 1,000 mcg by mouth daily.     Allergies:   Oxycodone   Social History   Tobacco Use  . Smoking status: Never Smoker  . Smokeless tobacco: Never Used  Vaping Use  . Vaping Use: Never used  Substance Use Topics  . Alcohol use: Never  . Drug use: Never     Family Hx: The patient's family history includes Diabetes in her mother; Hyperlipidemia in her mother; Hypertension in her mother.  ROS:   Please see the history of present illness.      Labs/Other Tests and Data Reviewed:    EKG:  No ECG reviewed.  Recent Labs: 10/13/2020: ALT 13; B Natriuretic Peptide 36.4 10/14/2020: TSH 1.606 10/15/2020: BUN 9; Creatinine, Ser 0.77; Hemoglobin 10.1; Platelets 240; Potassium 4.3; Sodium 136   Recent Lipid Panel Lab Results  Component Value Date/Time   CHOL 171 10/14/2020 04:08 AM   TRIG 146 10/14/2020 04:08 AM   HDL 36 (L) 10/14/2020 04:08 AM   CHOLHDL 4.8 10/14/2020 04:08 AM   LDLCALC 106 (H) 10/14/2020 04:08 AM    Wt Readings from Last 3 Encounters:  10/23/20 257 lb (116.6 kg)  10/15/20 280 lb 10.3 oz (127.3 kg)  04/12/20 278 lb 9.6 oz (126.4 kg)     Risk Assessment/Calculations:      Objective:    Vital Signs:  BP (!) 143/78   Ht 5\' 5"  (1.651 m)   Wt 257 lb (116.6 kg)   LMP  (LMP Unknown)   BMI 42.77 kg/m    VITAL SIGNS:  reviewed GEN:  no acute distress PSYCH:  normal affect  ASSESSMENT & PLAN:    1. Syncope and collapse Etiology not entirely clear.  She likely had a vasovagal event.  Echocardiogram in the hospital demonstrated normal LV function and no significant valvular pathology.   Therefore, her risk of sudden cardiac death is extremely low.  She just received her event monitor today.  This will help rule out bradycardia arrhythmias.  I will make sure she has follow-up after her monitor is complete.  2. Other chest pain We reviewed the findings on her echocardiogram and coronary CTA.  The results are quite reassuring.  She had no evidence of CAD with a calcium score of 0 on her CT and normal EF on echocardiogram.  Question of her chest symptoms may be related to acid reflux.  I have recommended that she try taking famotidine (over-the-counter) twice daily for 2 weeks to see if this helps.  If she has improvement, she should follow-up with  primary care for further evaluation management.  3. Essential hypertension Blood pressure above goal.  I think she would benefit from the addition of amlodipine to lisinopril.  Start amlodipine 5 mg daily.  4. Aortic atherosclerosis (HCC) Noted on CT scan.  Continue aspirin, statin therapy.     Time:   Today, I have spent 13 minutes with the patient with telehealth technology discussing the above problems.     Medication Adjustments/Labs and Tests Ordered: Current medicines are reviewed at length with the patient today.  Concerns regarding medicines are outlined above.   Tests Ordered: No orders of the defined types were placed in this encounter.   Medication Changes: Meds ordered this encounter  Medications  . amLODipine (NORVASC) 5 MG tablet    Sig: Take 1 tablet (5 mg total) by mouth daily.    Dispense:  30 tablet    Refill:  6    Follow Up:  In Person in 6 week(s)  Signed, Tereso Newcomer, PA-C  10/23/2020 4:45 PM    [facility]  Addendum The event monitor showed 3 episodes of transient complete heart block during sleep.  Dr. Mayford Knife has recommended proceeding with in lab PSG to rule out sleep apnea as a cause. Tereso Newcomer, PA-C    12/25/2020 2:19 PM

## 2020-11-02 ENCOUNTER — Telehealth: Payer: Self-pay | Admitting: Cardiology

## 2020-11-02 NOTE — Telephone Encounter (Signed)
Patient states she had to take off her heart monitor last night, because it was giving her a rash. Patient states she was sent one for sensitive skin, but it is worse than the other one she tried. Please advise.

## 2020-11-02 NOTE — Telephone Encounter (Signed)
Patient has very sensitive skin she is currently using [employer] attachment and is having an allergic reaction. I have asked her to remove monitor and let her skin rest for a day. She willing to come Monday and pick up some  A different type of sensitive skin electrodes from our office stock.

## 2020-11-06 ENCOUNTER — Ambulatory Visit: Payer: 59 | Admitting: Nurse Practitioner

## 2020-11-08 ENCOUNTER — Ambulatory Visit (INDEPENDENT_AMBULATORY_CARE_PROVIDER_SITE_OTHER): Payer: 59 | Admitting: Pulmonary Disease

## 2020-11-08 ENCOUNTER — Other Ambulatory Visit: Payer: Self-pay

## 2020-11-08 DIAGNOSIS — R0600 Dyspnea, unspecified: Secondary | ICD-10-CM | POA: Diagnosis not present

## 2020-11-08 DIAGNOSIS — R911 Solitary pulmonary nodule: Secondary | ICD-10-CM

## 2020-11-08 DIAGNOSIS — R0689 Other abnormalities of breathing: Secondary | ICD-10-CM | POA: Diagnosis not present

## 2020-11-08 NOTE — Addendum Note (Signed)
Addended by: Delrae Rend on: 11/08/2020 11:18 AM   Modules accepted: Orders

## 2020-11-08 NOTE — Patient Instructions (Signed)
Will order follow-up CT without contrast in 12 months for lung nodules  Check with your insurance company about preferred inhalers Follow-up in 6 months.

## 2020-11-08 NOTE — Progress Notes (Signed)
Molly Flores    [id number]    04/03/1956  Primary Care Physician:Flores, Molly Brown, PA  Referring Physician: Adrienne Mocha, PA [address]  [place],  [place] [address]  Virtual Visit via Telephone Note  I connected with Molly Flores on 11/08/20 at  9:30 AM EST by telephone and verified that I am speaking with the correct person using two identifiers.  Location: Patient: Home Provider: [facility], [address], [place], [place]   I discussed the limitations, risks, security and privacy concerns of performing an evaluation and management service by telephone and the availability of in person appointments. I also discussed with the patient that there may be a patient responsible charge related to this service. The patient expressed understanding and agreed to proceed.  Chief complaint:  Consult for wheezing, lung nodule  HPI: 64 year old with history of hypertension, hyperlipidemia, stroke, allergies She had a viral-like illness in February 2020 which she thinks is Covid.  Got tested at that time She had multiple emergency room visits while living at [facility] and a CTA done in March 2020 showed groundglass nodule in the right lung.  Since that episode she continues to have dyspnea at rest and with exertion associated with wheezing.  She just has Ventolin which she uses as needed several times a week. Seen in urgent care last month for dyspnea and wheezing and given 10 days of prednisone.  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  No mold, hot tub, Jacuzzi Smoking history: Never smoker.  Had been exposed to secondhand smoke in the past Travel history: Briefly lived in [place].  No significant recent travel Relevant family history: No significant family history of lung disease  Interim history: Prescribed Symbicort but could not afford it.  She is using albuterol as needed Continues to have dyspnea with occasional  wheezing  Recently hospitalized for dizziness, syncope.  She had a cardiac work-up and a CTA which is negative for any cardiac abnormalities or pulmonary embolism.   Outpatient Encounter Medications as of 11/08/2020  Medication Sig  . amLODipine (NORVASC) 5 MG tablet Take 1 tablet (5 mg total) by mouth daily.  . Ascorbic Acid (VITAMIN C) 500 MG CAPS Take 1 capsule by mouth daily.  Marland Kitchen aspirin 81 MG chewable tablet Chew 81 mg by mouth daily.  . Cholecalciferol (VITAMIN D3) 1.25 MG (50000 UT) TABS Take 1 tablet by mouth daily.  . Ferrous Sulfate (IRON) 325 (65 Fe) MG TABS Take 1 tablet by mouth daily.  Marland Kitchen lisinopril (ZESTRIL) 10 MG tablet Take 1 tablet (10 mg total) by mouth daily.  . pravastatin (PRAVACHOL) 40 MG tablet Take 40 mg by mouth daily.  . vitamin B-12 (CYANOCOBALAMIN) 1000 MCG tablet Take 1,000 mcg by mouth daily.   No facility-administered encounter medications on file as of 11/08/2020.    Physical Exam: Virtual visit  Data Reviewed: Imaging: CTA [facility] 12/09/2018-normal study  CTA [facility] 02/08/2019- 7 mm, noncalcified solitary pulmonary nodule in the anterior segment of the right upper lobe, unchanged in size and appearance when compared to the prior study.  CT chest 03/16/2020-6 mm nodule along the minor fissure, faint groundglass 10 mm nodule in the anterior segment of right lower lobe, 5 mm lingular nodule.  CTA 10/13/2020-no PE, stable lung nodule  CT coronaries 10/15/2020-coronary calcium of 0, stable pulmonary nodule I have reviewed the images personally.  PFTs: 01/03/2020 FVC 3.41 [64%], FEV1  1.68 [70%], F/F 77, DLCO 10.22 [46%] Mild restriction, severe diffusion defect  Labs: CBC 03/02/2020-WBC 5.6, hemoglobin 10.8 CBC 10/15/2020-WBC 4.9, eos 2%, blood eosinophil count 98 IgE 04/12/2020-79  Cardiac: Echocardiogram 10/14/2020-LVEF 60-65%, RV systolic function and size is normal.   Assessment:  Dyspnea, wheezing May have reactive airway disease, asthma though  PFTs with no obstruction noted.  No evidence of peripheral eosinophilia or elevated IgE Cannot afford Symbicort.  Will check with insurance company to see what her preferred inhaler is  Not sure why she has low diffusion capacity as there is no evidence of ILD on CT scan or pulmonary hypertension on echo.  Suggested a cardiopulmonary exercise test for further evaluation of dyspnea, exercise-induced bronchospasm or deconditioning but she would like to avoid for now.  Lung nodule Stable on follow-up imaging Follow-up CT in 12 months.  Plan/Recommendations: Check with insurance about preferred inhalers Follow-up CT  I discussed the assessment and treatment plan with the patient. The patient was provided an opportunity to ask questions and all were answered. The patient agreed with the plan and demonstrated an understanding of the instructions.   The patient was advised to call back or seek an in-person evaluation if the symptoms worsen or if the condition fails to improve as anticipated.  I provided 25 minutes of non-face-to-face time during this encounter.   Chilton Greathouse MD [facility] 11/08/2020, 9:57 AM  CC: Adrienne Mocha, PA

## 2020-11-29 ENCOUNTER — Telehealth: Payer: Self-pay | Admitting: Cardiology

## 2020-11-29 NOTE — Telephone Encounter (Signed)
The patient has been notified of the result and verbalized understanding.  All questions (if any) were answered. Theresia Majors, RN 11/29/2020 12:23 PM

## 2020-11-29 NOTE — Telephone Encounter (Signed)
Patient was returning phone call for results. Please call back

## 2020-12-03 ENCOUNTER — Telehealth: Payer: Self-pay

## 2020-12-03 DIAGNOSIS — R0609 Other forms of dyspnea: Secondary | ICD-10-CM

## 2020-12-03 DIAGNOSIS — I442 Atrioventricular block, complete: Secondary | ICD-10-CM

## 2020-12-03 DIAGNOSIS — R0600 Dyspnea, unspecified: Secondary | ICD-10-CM

## 2020-12-03 NOTE — Telephone Encounter (Signed)
-----   Message from Quintella Reichert, MD sent at 11/29/2020  1:48 PM EST ----- Please get an in lab PSG to rule out sleep apnea as an etiology of heart block during sleeo

## 2020-12-03 NOTE — Telephone Encounter (Signed)
Spoke with the patient who is aware that a sleep study has been order. She will be contacted for it to be scheduled. Orders have been placed.

## 2020-12-05 ENCOUNTER — Telehealth: Payer: Self-pay | Admitting: *Deleted

## 2020-12-05 NOTE — Telephone Encounter (Signed)
-----   Message from Theresia Majors, RN sent at 12/03/2020  5:10 PM EST ----- In lab PSG sleep study has been ordered.  Thanks!

## 2020-12-07 ENCOUNTER — Telehealth: Payer: Self-pay | Admitting: Cardiology

## 2020-12-07 DIAGNOSIS — I10 Essential (primary) hypertension: Secondary | ICD-10-CM

## 2020-12-07 DIAGNOSIS — R079 Chest pain, unspecified: Secondary | ICD-10-CM

## 2020-12-07 NOTE — Telephone Encounter (Signed)
PSG sleep study pending approval.  Clinicals faxed to [facility].

## 2020-12-10 NOTE — Telephone Encounter (Signed)
[facility] [employer] denied the PSG study. Case #[id number]

## 2020-12-11 ENCOUNTER — Ambulatory Visit: Payer: 59 | Admitting: Cardiology

## 2020-12-13 NOTE — Addendum Note (Signed)
Addended by: Reesa Chew on: 12/13/2020 03:56 PM   Modules accepted: Orders

## 2020-12-13 NOTE — Telephone Encounter (Signed)
HOME SLEEP TEST sent to sleep pool for prior auth.

## 2020-12-13 NOTE — Telephone Encounter (Signed)
Yes please

## 2020-12-26 ENCOUNTER — Telehealth: Payer: Self-pay | Admitting: *Deleted

## 2020-12-26 NOTE — Telephone Encounter (Signed)
PA submitted to [facility] for HST.

## 2020-12-26 NOTE — Telephone Encounter (Signed)
-----   Message from Reesa Chew, CMA sent at 12/13/2020  3:53 PM EST ----- Regarding: RESUBMITT FOR HST HOME SLEEP TEST ----- Message ----- From: Pollyann Kennedy Sent: 12/10/2020   2:41 PM EST To: Reesa Chew, CMA  [facility] denied the PSG study. Case #[id number]

## 2020-12-28 NOTE — Telephone Encounter (Signed)
[facility] denied HST CASE #[id number]. 

## 2020-12-28 NOTE — Telephone Encounter (Signed)
[facility] denied HST CASE #[id number].

## 2020-12-29 NOTE — Telephone Encounter (Signed)
Please call insurance and tell them patient is having nocturnal bradycardia, heart block and needs some type of sleep study to rule out OSA

## 2021-03-06 ENCOUNTER — Ambulatory Visit (HOSPITAL_COMMUNITY)
Admission: EM | Admit: 2021-03-06 | Discharge: 2021-03-06 | Disposition: A | Discharge status: Home / Self Care | Payer: 59 | Attending: Emergency Medicine | Admitting: Emergency Medicine

## 2021-03-06 ENCOUNTER — Other Ambulatory Visit: Payer: Self-pay

## 2021-03-06 ENCOUNTER — Encounter (HOSPITAL_COMMUNITY): Payer: Self-pay | Admitting: Emergency Medicine

## 2021-03-06 DIAGNOSIS — Z20822 Contact with and (suspected) exposure to covid-19: Secondary | ICD-10-CM | POA: Insufficient documentation

## 2021-03-06 DIAGNOSIS — R509 Fever, unspecified: Secondary | ICD-10-CM | POA: Diagnosis present

## 2021-03-06 DIAGNOSIS — H66003 Acute suppurative otitis media without spontaneous rupture of ear drum, bilateral: Secondary | ICD-10-CM | POA: Diagnosis not present

## 2021-03-06 DIAGNOSIS — J039 Acute tonsillitis, unspecified: Secondary | ICD-10-CM | POA: Diagnosis not present

## 2021-03-06 LAB — SARS CORONAVIRUS 2 (TAT 6-24 HRS): SARS Coronavirus 2: NEGATIVE

## 2021-03-06 LAB — POCT RAPID STREP A, ED / UC: Streptococcus, Group A Screen (Direct): NEGATIVE

## 2021-03-06 MED ORDER — DEXAMETHASONE SODIUM PHOSPHATE 10 MG/ML IJ SOLN
INTRAMUSCULAR | Status: AC
  Filled 2021-03-06: qty 1

## 2021-03-06 MED ORDER — AMOXICILLIN 500 MG PO CAPS: 500.0000 mg | ORAL_CAPSULE | Freq: Two times a day (BID) | ORAL | 0 refills | Status: AC

## 2021-03-06 MED ORDER — DEXAMETHASONE SODIUM PHOSPHATE 10 MG/ML IJ SOLN
10.0000 mg | Freq: Once | INTRAMUSCULAR | Status: AC
  Administered 2021-03-06: 10 mg via INTRAMUSCULAR

## 2021-03-06 MED ORDER — IBUPROFEN 800 MG PO TABS
800.0000 mg | ORAL_TABLET | Freq: Once | ORAL | Status: AC
  Administered 2021-03-06: 800 mg via ORAL

## 2021-03-06 MED ORDER — IBUPROFEN 800 MG PO TABS
ORAL_TABLET | ORAL | Status: AC
  Filled 2021-03-06: qty 1

## 2021-03-06 NOTE — Discharge Instructions (Addendum)
Take the Amoxil twice a day for the next 10 days.    You may also use Ibuprofen/Tylenol as needed for fever reduction and pain relief.  Warm beverages, including hot teas and hot water with lemon, can help to relieve throat pain.  You can also gargle salt water and use throat sprays/lozenges.   Drink lots of fluids and rest.   We will contact you if your throat culture or COVID test are positive.   Return or go to the Emergency Department if symptoms worsen or do not improve in the next few days.

## 2021-03-06 NOTE — ED Triage Notes (Signed)
Pt presents with chills, fever, sore throat and headache that started yesterday. States too tylenol last around 0600 this am.

## 2021-03-06 NOTE — ED Provider Notes (Signed)
[facility]    CSN: [id number] Arrival date & time: 03/06/21  1031      History   Chief Complaint Chief Complaint  Patient presents with  . Fever  . Chills  . Sore Throat    HPI Molly Flores is a 65 y.o. female.   Patient here for evaluation of sore throat and bilateral ear pain that started yesterday.  Also reports having some chills.  Temperature at home was 102.8, temperature in office 102.4.  Patient reports taking Tylenol prior to arrival and given ibuprofen in office.  Denies any sick contacts. Denies any specific alleviating or aggravating factors.  Denies any fevers, chest pain, shortness of breath, N/V/D, numbness, tingling, weakness, abdominal pain, or headaches.   ROS: As per HPI, all other pertinent ROS negative   The history is provided by the patient.  Fever Sore Throat    Past Medical History:  Diagnosis Date  . Anemia   . Borderline diabetes   . Carotid US    [facility] 01/2019: no ICA stenosis  . Dyspnea   . History of cardiac catheterization    [facility] 10/2012:  no CAD  . History of nuclear stress test    [facility]iew at [facility] 01/2019: no ischemia  . History of small bowel obstruction   . History of TIA (transient ischemic attack)   . Hyperlipidemia   . Hypertension     Patient Active Problem List   Diagnosis Date Noted  . Syncope 10/14/2020  . DOE (dyspnea on exertion) 10/14/2020  . Chest pain, rule out acute myocardial infarction 10/14/2020  . Pulmonary nodules 10/14/2020    Past Surgical History:  Procedure Laterality Date  . ABDOMINAL HYSTERECTOMY    . ABDOMINAL SURGERY    . DIAGNOSTIC LAPAROSCOPY    . LAPAROSCOPIC OOPHERECTOMY Bilateral     OB History   No obstetric history on file.      Home Medications    Prior to Admission medications   Medication Sig Start Date End Date Taking? Authorizing Provider  amoxicillin (AMOXIL) 500 MG capsule Take 1 capsule (500 mg total) by mouth 2 (two) times daily for 10 days. 03/06/21 03/16/21  Yes Ivette Loyal, NP  amLODipine (NORVASC) 5 MG tablet Take 1 tablet (5 mg total) by mouth daily. 10/23/20   Tereso Newcomer T, PA-C  Ascorbic Acid (VITAMIN C) 500 MG CAPS Take 1 capsule by mouth daily.    [provider]  aspirin 81 MG chewable tablet Chew 81 mg by mouth daily.    [provider]  Cholecalciferol (VITAMIN D3) 1.25 MG (50000 UT) TABS Take 1 tablet by mouth daily.    [provider]  Ferrous Sulfate (IRON) 325 (65 Fe) MG TABS Take 1 tablet by mouth daily.    [provider]  lisinopril (ZESTRIL) 10 MG tablet Take 1 tablet (10 mg total) by mouth daily. 10/16/20   Swayze, Ava, DO  pravastatin (PRAVACHOL) 40 MG tablet Take 40 mg by mouth daily. 10/22/20   [provider]  vitamin B-12 (CYANOCOBALAMIN) 1000 MCG tablet Take 1,000 mcg by mouth daily.    [provider]    Family History Family History  Problem Relation Age of Onset  . Diabetes Mother   . Hyperlipidemia Mother   . Hypertension Mother     Social History Social History   Tobacco Use  . Smoking status: Never Smoker  . Smokeless tobacco: Never Used  Vaping Use  . Vaping Use: Never used  Substance Use  Topics  . Alcohol use: Never  . Drug use: Never     Allergies   Oxycodone   Review of Systems Review of Systems  Constitutional: Positive for fever.     Physical Exam Triage Vital Signs ED Triage Vitals  Enc Vitals Group     BP 03/06/21 1105 (!) 158/89     Pulse Rate 03/06/21 1105 (!) 104     Resp 03/06/21 1105 18     Temp 03/06/21 1105 (!) 102.4 F (39.1 C)     Temp Source 03/06/21 1105 Oral     SpO2 03/06/21 1105 100 %     Weight --      Height --      Head Circumference --      Peak Flow --      Pain Score 03/06/21 1104 4     Pain Loc --      Pain Edu? --      Excl. in GC? --    No data found.  Updated Vital Signs BP (!) 158/89 (BP Location: Right Arm)   Pulse (!) 104   Temp (!) 102.4 F (39.1 C) (Oral)   Resp 18   LMP   (LMP Unknown)   SpO2 100%   Visual Acuity Right Eye Distance:   Left Eye Distance:   Bilateral Distance:    Right Eye Near:   Left Eye Near:    Bilateral Near:     Physical Exam Vitals and nursing note reviewed.  Constitutional:      General: She is not in acute distress.    Appearance: Normal appearance. She is not ill-appearing, toxic-appearing or diaphoretic.  HENT:     Head: Normocephalic and atraumatic.     Right Ear: No drainage or tenderness. Tympanic membrane is erythematous.     Left Ear: Tympanic membrane is erythematous.     Mouth/Throat:     Pharynx: Pharyngeal swelling and posterior oropharyngeal erythema present.     Tonsils: Tonsillar exudate present. No tonsillar abscesses (tonsils with evidence of ulceration but no abscess). 2+ on the right. 2+ on the left.  Eyes:     Conjunctiva/sclera: Conjunctivae normal.  Cardiovascular:     Rate and Rhythm: Normal rate and regular rhythm.     Pulses: Normal pulses.     Heart sounds: Normal heart sounds.  Pulmonary:     Effort: Pulmonary effort is normal.     Breath sounds: Normal breath sounds.  Abdominal:     General: Abdomen is flat.  Musculoskeletal:        General: Normal range of motion.     Cervical back: Normal range of motion.  Skin:    General: Skin is warm and dry.  Neurological:     General: No focal deficit present.     Mental Status: She is alert and oriented to person, place, and time.  Psychiatric:        Mood and Affect: Mood normal.      UC Treatments / Results  Labs (all labs ordered are listed, but only abnormal results are displayed) Labs Reviewed  CULTURE, GROUP A STREP ([facility])  SARS CORONAVIRUS 2 (TAT 6-24 HRS)  POCT RAPID STREP A, ED / UC    EKG   Radiology No results found.  Procedures Procedures (including critical care time)  Medications Ordered in UC Medications  ibuprofen (ADVIL) tablet 800 mg (800 mg Oral Given 03/06/21 1109)  dexamethasone (DECADRON) injection 10 mg  (10 mg Intramuscular Given 03/06/21 1155)  Initial Impression / Assessment and Plan / UC Course  I have reviewed the triage vital signs and the nursing notes.  Pertinent labs & imaging results that were available during my care of the patient were reviewed by me and considered in my medical decision making (see chart for details).     Cute tonsillitis, fever, bilateral otitis media Assessment negative for red flags or concerns in including tonsillar abscess. Decadron 10 mg IM administered in office for tonsillar swelling and ulceration. Based on modified [facility] criteria will treat with antibiotics for tonsillitis.  Amoxil twice daily x10 days prescribed for coverage of otitis media and tonsillitis. Continue ibuprofen and/or Tylenol as needed for fever reduction and pain relief. Discussed sore throat management including warm salt water gargle, hot beverages, popsicles, throat sprays and lozenges. Rest and quarantine until Covid results come back.  Work note provided. Follow-up with primary care provider in the next few days if not improving.  Final Clinical Impressions(s) / UC Diagnoses   Final diagnoses:  Acute tonsillitis, unspecified etiology  Fever, unspecified fever cause  Non-recurrent acute suppurative otitis media of both ears without spontaneous rupture of tympanic membranes     Discharge Instructions     Take the Amoxil twice a day for the next 10 days.    You may also use Ibuprofen/Tylenol as needed for fever reduction and pain relief.  Warm beverages, including hot teas and hot water with lemon, can help to relieve throat pain.  You can also gargle salt water and use throat sprays/lozenges.   Drink lots of fluids and rest.   We will contact you if your throat culture or COVID test are positive.   Return or go to the Emergency Department if symptoms worsen or do not improve in the next few days.       ED Prescriptions    Medication Sig Dispense Auth. Provider    amoxicillin (AMOXIL) 500 MG capsule Take 1 capsule (500 mg total) by mouth 2 (two) times daily for 10 days. 20 capsule Ivette Loyal, NP     PDMP not reviewed this encounter.   Ivette Loyal, NP 03/06/21 [phone]

## 2021-03-07 LAB — CULTURE, GROUP A STREP (THRC)

## 2021-04-02 NOTE — Telephone Encounter (Signed)
Insurance would not approve sleep study patient needs to be seen. Talked to the patient and she agrees to be seen through a video visit appointment scheduled for 05-03-21.

## 2021-04-03 ENCOUNTER — Other Ambulatory Visit: Payer: Self-pay | Admitting: Physician Assistant

## 2021-04-03 DIAGNOSIS — Z1231 Encounter for screening mammogram for malignant neoplasm of breast: Secondary | ICD-10-CM

## 2021-05-03 ENCOUNTER — Encounter: Payer: Self-pay | Admitting: Cardiology

## 2021-05-03 ENCOUNTER — Other Ambulatory Visit: Payer: Self-pay

## 2021-05-03 ENCOUNTER — Telehealth (INDEPENDENT_AMBULATORY_CARE_PROVIDER_SITE_OTHER): Payer: 59 | Admitting: Cardiology

## 2021-05-03 VITALS — BP 135/82 | HR 80 | Ht 65.0 in | Wt 254.0 lb

## 2021-05-03 DIAGNOSIS — R55 Syncope and collapse: Secondary | ICD-10-CM

## 2021-05-03 DIAGNOSIS — R079 Chest pain, unspecified: Secondary | ICD-10-CM

## 2021-05-03 DIAGNOSIS — I10 Essential (primary) hypertension: Secondary | ICD-10-CM

## 2021-05-03 DIAGNOSIS — R0600 Dyspnea, unspecified: Secondary | ICD-10-CM | POA: Diagnosis not present

## 2021-05-03 DIAGNOSIS — R0609 Other forms of dyspnea: Secondary | ICD-10-CM

## 2021-05-03 NOTE — Progress Notes (Signed)
Virtual Visit via Video Note   This visit type was conducted due to national recommendations for restrictions regarding the COVID-19 Pandemic (e.g. social distancing) in an effort to limit this patient's exposure and mitigate transmission in our community.  Due to her co-morbid illnesses, this patient is at least at moderate risk for complications without adequate follow up.  This format is felt to be most appropriate for this patient at this time.  All issues noted in this document were discussed and addressed.  A limited physical exam was performed with this format.  Please refer to the patient's chart for her consent to telehealth for [facility].  Date:  05/03/2021   ID:  Molly Flores, DOB Feb 09, 1956, MRN [id number] The patient was identified using 2 identifiers.  Patient Location: Home Provider Location: Office/Clinic  Cardiology Office Note:    Date:  05/03/2021   ID:  Molly Flores, DOB 08-10-1956, MRN [id number]  PCP:  Adrienne Mocha, PA  Cardiologist:  Armanda Magic, MD    Referring MD: Adrienne Mocha, PA   Chief Complaint  Patient presents with  . Chest Pain  . Shortness of Breath  . Hypertension    History of Present Illness:    Molly Flores is a 65 y.o. female with a hx of  hypertension, hyperlipidemia, prior stroke, pulmonary nodule and recent evaluation by pulmonology for ongoing shortness of breath since viral syndrome in February 2020 whowas hospitalized in Nov 2021 with chest pain and syncope .She has a hx of cardiac cath in 2013 showing no CAD.  Coronary CTA done in Nov 2021 showed no CAD and Ca score of 0 and echo showed normal LVF.    She was seen back by Tereso Newcomer, PA and was still complaining of chronic constant CP and chronic DOE.  It was felt that she likely had a vasovagal event in Nov 2021 resulting in her syncopal episode.  She was started on Pepcid for her CP and instructed to followup with PCP for non cardiac CP.  Her BP was also elevated and she  was started on amlodipine 5mg  daily in addition to her lisinopril.  She wore a heart monitor which showed 3 dropped beats during sleep but no other arrhythmias.  A PSG was ordered to rule out OSA but this has not bee done.    She is here today for followup and is doing well.  She tells that the pepcid completely resolved her CP.  She denies any chest pain or pressure, SOB, DOE, PND, orthopnea, dizziness, palpitations or syncope. She has occasional mild LE edema in the summer.  She is compliant with her meds and is tolerating meds with no SE.  She has been told that she snores in her sleep but has never awakened herself snoring or gasping for for breath.  She does feel tired in the am when she wakes up and does have some problems with excessive daytime sleepiness.   Past Medical History:  Diagnosis Date  . Anemia   . Borderline diabetes   . Carotid 09-26-1976    [facility] 01/2019: no ICA stenosis  . Dyspnea   . History of cardiac catheterization    [facility] 10/2012:  no CAD  . History of nuclear stress test    [facility]oview at [facility] 01/2019: no ischemia  . History of small bowel obstruction   . History of TIA (transient ischemic attack)   . Hyperlipidemia   . Hypertension     Past Surgical  History:  Procedure Laterality Date  . ABDOMINAL HYSTERECTOMY    . ABDOMINAL SURGERY    . DIAGNOSTIC LAPAROSCOPY    . LAPAROSCOPIC OOPHERECTOMY Bilateral     Current Medications: Current Meds  Medication Sig  . ALBUTEROL IN Inhale into the lungs.  Marland Kitchen amLODipine (NORVASC) 5 MG tablet Take 1 tablet (5 mg total) by mouth daily. (Patient taking differently: Take 10 mg by mouth daily.)  . Ascorbic Acid (VITAMIN C) 500 MG CAPS Take 1 capsule by mouth daily.  Marland Kitchen aspirin 81 MG chewable tablet Chew 81 mg by mouth daily.  . cetirizine (ZYRTEC) 10 MG tablet Take 10 mg by mouth as needed for allergies.  . Cholecalciferol (VITAMIN D3) 1.25 MG (50000 UT) TABS Take 1 tablet by mouth daily.  . Ferrous Sulfate (IRON) 325 (65 Fe) MG  TABS Take 1 tablet by mouth daily.  . pravastatin (PRAVACHOL) 40 MG tablet Take 40 mg by mouth daily.  . vitamin B-12 (CYANOCOBALAMIN) 1000 MCG tablet Take 1,000 mcg by mouth daily.     Allergies:   Oxycodone   Social History   Socioeconomic History  . Marital status: Divorced    Spouse name: Not on file  . Number of children: 1  . Years of education: Not on file  . Highest education level: Not on file  Occupational History  . Occupation: Nanny  Tobacco Use  . Smoking status: Never Smoker  . Smokeless tobacco: Never Used  Vaping Use  . Vaping Use: Never used  Substance and Sexual Activity  . Alcohol use: Never  . Drug use: Never  . Sexual activity: Not Currently    Birth control/protection: Abstinence  Other Topics Concern  . Not on file  Social History Narrative  . Not on file   Social Determinants of Health   Financial Resource Strain: Not on file  Food Insecurity: Not on file  Transportation Needs: Not on file  Physical Activity: Not on file  Stress: Not on file  Social Connections: Not on file     Family History: The patient's family history includes Diabetes in her mother; Hyperlipidemia in her mother; Hypertension in her mother.  ROS:   Please see the history of present illness.    ROS  All other systems reviewed and negative.   EKGs/Labs/Other Studies Reviewed:    The following studies were reviewed today: 2D echo, coronary CTA, event monitor  EKG:  EKG is not ordered today.    Recent Labs: 10/13/2020: ALT 13; B Natriuretic Peptide 36.4 10/14/2020: TSH 1.606 10/15/2020: BUN 9; Creatinine, Ser 0.77; Hemoglobin 10.1; Platelets 240; Potassium 4.3; Sodium 136   Recent Lipid Panel    Component Value Date/Time   CHOL 171 10/14/2020 0408   TRIG 146 10/14/2020 0408   HDL 36 (L) 10/14/2020 0408   CHOLHDL 4.8 10/14/2020 0408   VLDL 29 10/14/2020 0408   LDLCALC 106 (H) 10/14/2020 0408     Physical Exam:    VS:  BP 135/82   Pulse 80   Ht 5\' 5"   (1.651 m)   Wt 254 lb (115.2 kg)   LMP  (LMP Unknown)   BMI 42.27 kg/m     Wt Readings from Last 3 Encounters:  05/03/21 254 lb (115.2 kg)  10/23/20 257 lb (116.6 kg)  10/15/20 280 lb 10.3 oz (127.3 kg)     Well nourished, well developed female in no acute distress. Well appearing, alert and conversant, regular work of breathing,  good skin color  Eyes- anicteric mouth-  oral mucosa is pink  neuro- grossly intact skin- no apparent rash or lesions or cyanosis   ASSESSMENT:    1. Chest pain, rule out acute myocardial infarction   2. DOE (dyspnea on exertion)   3. Vasovagal syncope   4. Essential hypertension    PLAN:    In order of problems listed above:  1.  Chest pain -non cardiac in origin -Cardiac cath normal in 2013 and coronary CTA 10/2020 was normal with Cor Ca score of 0 -2D echo normal -CP resolved on Pepcid  2.  DOE -started after viral illness -2D echo with normal LVF and no CAD -her SOB has completely resolved  3.  Syncope -felt vasovagal in origin -event monitor showed 3 dropped heart beats during sleep -she has had some problems with daytime sleepiness -I will get a PSG to rule out OSA  4.  HTN -BP controlled one exam today -Continue prescription drug management with Amlodipine 5mg  daily and Lisinopril 10mg  daily -I have personally reviewed and interpreted outside labs performed by patient's PCP which showed SCr 0.72, K+ 4.4 and TSH 0.89 in Jan 2022  COVID-19 Education: The signs and symptoms of COVID-19 were discussed with the patient and how to seek care for testing (follow up with PCP or arrange E-visit).  The importance of social distancing was discussed today.  Time Spent: 15 minutes total time of encounter, including 5 minutes spent in face-to-face patient care on the date of this encounter. This time includes coordination of care and counseling regarding above mentioned problem list. Remainder of non-face-to-face time involved reviewing chart  documents/testing relevant to the patient encounter and documentation in the medical record. I have independently reviewed documentation from referring provider  Medication Adjustments/Labs and Tests Ordered: Current medicines are reviewed at length with the patient today.  Concerns regarding medicines are outlined above.  No orders of the defined types were placed in this encounter.  No orders of the defined types were placed in this encounter.   Signed, , MD  05/03/2021 10:21 AM    [facility]

## 2021-05-06 ENCOUNTER — Ambulatory Visit (INDEPENDENT_AMBULATORY_CARE_PROVIDER_SITE_OTHER): Payer: 59 | Admitting: Otolaryngology

## 2021-05-06 ENCOUNTER — Other Ambulatory Visit: Payer: Self-pay

## 2021-05-06 VITALS — Temp 97.2°F

## 2021-05-06 DIAGNOSIS — R490 Dysphonia: Secondary | ICD-10-CM | POA: Diagnosis not present

## 2021-05-06 DIAGNOSIS — K219 Gastro-esophageal reflux disease without esophagitis: Secondary | ICD-10-CM | POA: Diagnosis not present

## 2021-05-06 NOTE — Progress Notes (Signed)
HPI: Molly Flores is a 65 y.o. female who presents is referred by Roslynn Amble, PA for evaluation of intermittent hoarseness.  She likes to sing at church and sometimes loses her voice.  She states that she will occasionally lose her voice for 10 to 15 minutes while talking but it never lasts more than a short period of time.  She has no significant hoarseness in the office today.  She does have history of GE reflux disease and takes an acid medication occasionally but only a few times a month. She does not smoke. She has occasional sore throats and has had strep infection involving her tonsils 2-3 times a year. She denies any sore throat presently. She apparently does have pulmonary nodules that is followed by pulmonary.  Past Medical History:  Diagnosis Date  . Anemia   . Borderline diabetes   . Carotid US    [facility] 01/2019: no ICA stenosis  . Dyspnea   . History of cardiac catheterization    [facility] 10/2012:  no CAD  . History of nuclear stress test    [facility]iew at [facility] 01/2019: no ischemia  . History of small bowel obstruction   . History of TIA (transient ischemic attack)   . Hyperlipidemia   . Hypertension    Past Surgical History:  Procedure Laterality Date  . ABDOMINAL HYSTERECTOMY    . ABDOMINAL SURGERY    . DIAGNOSTIC LAPAROSCOPY    . LAPAROSCOPIC OOPHERECTOMY Bilateral    Social History   Socioeconomic History  . Marital status: Divorced    Spouse name: Not on file  . Number of children: 1  . Years of education: Not on file  . Highest education level: Not on file  Occupational History  . Occupation: Nanny  Tobacco Use  . Smoking status: Never Smoker  . Smokeless tobacco: Never Used  Vaping Use  . Vaping Use: Never used  Substance and Sexual Activity  . Alcohol use: Never  . Drug use: Never  . Sexual activity: Not Currently    Birth control/protection: Abstinence  Other Topics Concern  . Not on file  Social History Narrative  . Not on file   Social  Determinants of Health   Financial Resource Strain: Not on file  Food Insecurity: Not on file  Transportation Needs: Not on file  Physical Activity: Not on file  Stress: Not on file  Social Connections: Not on file   Family History  Problem Relation Age of Onset  . Diabetes Mother   . Hyperlipidemia Mother   . Hypertension Mother    Allergies  Allergen Reactions  . Oxycodone Other (See Comments)    Hallucinations    Prior to Admission medications   Medication Sig Start Date End Date Taking? Authorizing Provider  ALBUTEROL IN Inhale into the lungs.    [provider]  amLODipine (NORVASC) 5 MG tablet Take 1 tablet (5 mg total) by mouth daily. Patient taking differently: Take 10 mg by mouth daily. 10/23/20   Tereso Newcomer T, PA-C  Ascorbic Acid (VITAMIN C) 500 MG CAPS Take 1 capsule by mouth daily.    [provider]  aspirin 81 MG chewable tablet Chew 81 mg by mouth daily.    [provider]  cetirizine (ZYRTEC) 10 MG tablet Take 10 mg by mouth as needed for allergies.    [provider]  Cholecalciferol (VITAMIN D3) 1.25 MG (50000 UT) TABS Take 1 tablet by mouth daily.    [provider]  Ferrous Sulfate (  IRON) 325 (65 Fe) MG TABS Take 1 tablet by mouth daily.    [provider]  pravastatin (PRAVACHOL) 40 MG tablet Take 40 mg by mouth daily. 10/22/20   [provider]  vitamin B-12 (CYANOCOBALAMIN) 1000 MCG tablet Take 1,000 mcg by mouth daily.    [provider]     Positive ROS: Otherwise negative  All other systems have been reviewed and were otherwise negative with the exception of those mentioned in the HPI and as above.  Physical Exam: Constitutional: Alert, well-appearing, no acute distress Ears: External ears without lesions or tenderness. Ear canals are clear bilaterally with intact, clear TMs.  Nasal: External nose without lesions. Septum relatively midline with mild rhinitis and clear mucus  discharge from the middle meatus.. Clear nasal passages otherwise. Oral: Lips and gums without lesions. Tongue and palate mucosa without lesions. Posterior oropharynx clear.  Tonsils are 2+ moderate size but no exudate. Fiberoptic laryngoscopy was performed through the right nostril.  The nasopharynx was clear.  The base of tongue vallecula and epiglottis were normal.  The vocal cords were clear bilaterally with no vocal cord nodules or vocal cord lesions noted.  They had symmetric mobility on voicing. Neck: No palpable adenopathy or masses Respiratory: Breathing comfortably  Skin: No facial/neck lesions or rash noted.  Laryngoscopy  Date/Time: 05/06/2021 4:26 PM Performed by: Drema Halon, MD Authorized by: Drema Halon, MD   Consent:    Consent obtained:  Verbal   Consent given by:  Patient Procedure details:    Indications: hoarseness, dysphagia, or aspiration     Medication:  Afrin   Instrument: flexible fiberoptic laryngoscope     Scope location: right nare   Sinus:    Right nasopharynx: normal   Mouth:    Oropharynx: normal     Vallecula: normal     Base of tongue: normal     Epiglottis: normal   Throat:    Pyriform sinus: normal     True vocal cords: normal   Comments:     On fiberoptic laryngoscopy the vocal cords were clear bilaterally with normal vocal cord mobility.  Patient had essentially normal voice in the office today although she has occasional crackling secondary to mucous.  She has mild arytenoid edema which may be indicative of mild reflux symptoms but no vocal cord pathology noted.    Assessment: History of hoarseness with normal vocal cord examination. History of GE reflux disease.  Plan: Reassured patient of normal vocal cord examination with no vocal cord pathology noted on laryngoscopy. I discussed that reflux could contribute some to her hoarseness and if she has reflux symptoms would take antacid medication before dinner for better  nighttime coverage on a regular basis for 1 to 2 weeks. She will follow-up here for recheck if she has any persistent hoarseness that last more than 2 weeks.Narda Bonds, MD   CC:

## 2021-05-27 ENCOUNTER — Other Ambulatory Visit: Payer: Self-pay

## 2021-05-27 ENCOUNTER — Ambulatory Visit
Admission: RE | Admit: 2021-05-27 | Discharge: 2021-05-27 | Disposition: A | Discharge status: Home / Self Care | Payer: 59 | Source: Ambulatory Visit

## 2021-05-27 DIAGNOSIS — Z1231 Encounter for screening mammogram for malignant neoplasm of breast: Secondary | ICD-10-CM

## 2021-07-18 ENCOUNTER — Other Ambulatory Visit: Payer: Self-pay

## 2021-07-18 ENCOUNTER — Ambulatory Visit (HOSPITAL_COMMUNITY)
Admission: EM | Admit: 2021-07-18 | Discharge: 2021-07-18 | Disposition: A | Discharge status: Home / Self Care | Payer: 59 | Attending: Family Medicine | Admitting: Family Medicine

## 2021-07-18 DIAGNOSIS — M7061 Trochanteric bursitis, right hip: Secondary | ICD-10-CM | POA: Diagnosis not present

## 2021-07-18 MED ORDER — PREDNISONE 20 MG PO TABS: 40.0000 mg | ORAL_TABLET | Freq: Every day | ORAL | 0 refills | Status: DC

## 2021-07-18 MED ORDER — KETOROLAC TROMETHAMINE 30 MG/ML IJ SOLN
INTRAMUSCULAR | Status: AC
  Filled 2021-07-18: qty 1

## 2021-07-18 MED ORDER — KETOROLAC TROMETHAMINE 30 MG/ML IJ SOLN
30.0000 mg | Freq: Once | INTRAMUSCULAR | Status: AC
  Administered 2021-07-18: 30 mg via INTRAMUSCULAR

## 2021-07-18 NOTE — ED Triage Notes (Signed)
Pt said x 1 week has been having lower back pain on the right side running down into her leg and into groin area. No fall

## 2021-07-29 NOTE — ED Provider Notes (Signed)
[facility]   [id number] 07/18/21 Arrival Time: 1623  ASSESSMENT & PLAN:  1. Trochanteric bursitis of right hip    No indication for plain imaging at this time. Discussed. WBAT.  Meds ordered this encounter  Medications   ketorolac (TORADOL) 30 MG/ML injection 30 mg   predniSONE (DELTASONE) 20 MG tablet    Sig: Take 2 tablets (40 mg total) by mouth daily.    Dispense:  10 tablet    Refill:  0   Recommend:  Follow-up Information     Adrienne Mocha, PA .   Specialty: Physician Assistant Why: If worsening or failing to improve as anticipated. Contact information: [address]  [place] [address] [phone]                 Reviewed expectations re: course of current medical issues. Questions answered. Outlined signs and symptoms indicating need for more acute intervention. Patient verbalized understanding. After Visit Summary given.  SUBJECTIVE: History from: patient. Molly Flores is a 65 y.o. female who reports non-traumatic R hip/lower buttock pain; gradual onset; noted approx 1 wk ago. Seems worse with activities. No extremity sensation changes or weakness. Does affect sleep. Normal bowel/bladder habits. No tx PTA.   Past Surgical History:  Procedure Laterality Date   ABDOMINAL HYSTERECTOMY     ABDOMINAL SURGERY     DIAGNOSTIC LAPAROSCOPY     LAPAROSCOPIC OOPHERECTOMY Bilateral       OBJECTIVE:  Vitals:   07/18/21 1725  BP: (!) 154/86  Pulse: 73  Resp: 16  Temp: 97.8 F (36.6 C)  TempSrc: Oral  SpO2: 99%    General appearance: alert; no distress HEENT: [place]; AT Neck: supple with FROM Resp: unlabored respirations Extremities: RLE: warm with well perfused appearance; well localized marked tenderness over right trochanteric bursa; with gross deformities; swelling: none; bruising: none; hip and knee ROM: normal CV: brisk extremity capillary refill of RLE; 2+ DP pulse of RLE. Skin: warm and dry; no visible  rashes Neurologic: gait normal; normal sensation and strength of RLE Psychological: alert and cooperative; normal mood and affect   Allergies  Allergen Reactions   Oxycodone Other (See Comments)    Hallucinations     Past Medical History:  Diagnosis Date   Anemia    Borderline diabetes    Carotid US    [facility] 01/2019: no ICA stenosis   Dyspnea    History of cardiac catheterization    [facility] 10/2012:  no CAD   History of nuclear stress test    [facility]iew at [facility] 01/2019: no ischemia   History of small bowel obstruction    History of TIA (transient ischemic attack)    Hyperlipidemia    Hypertension    Social History   Socioeconomic History   Marital status: Divorced    Spouse name: Not on file   Number of children: 1   Years of education: Not on file   Highest education level: Not on file  Occupational History   Occupation: Nanny  Tobacco Use   Smoking status: Never   Smokeless tobacco: Never  Vaping Use   Vaping Use: Never used  Substance and Sexual Activity   Alcohol use: Never   Drug use: Never   Sexual activity: Not Currently    Birth control/protection: Abstinence  Other Topics Concern   Not on file  Social History Narrative   Not on file   Social Determinants of Health   Financial Resource Strain: Not on file  Food Insecurity: Not on file  Transportation Needs: Not on file  Physical Activity: Not on file  Stress: Not on file  Social Connections: Not on file   Family History  Problem Relation Age of Onset   Diabetes Mother    Hyperlipidemia Mother    Hypertension Mother    Past Surgical History:  Procedure Laterality Date   ABDOMINAL HYSTERECTOMY     ABDOMINAL SURGERY     DIAGNOSTIC LAPAROSCOPY     LAPAROSCOPIC OOPHERECTOMY Bilateral        Mardella Layman, MD 07/29/21 [phone]

## 2021-11-07 ENCOUNTER — Ambulatory Visit
Admission: RE | Admit: 2021-11-07 | Discharge: 2021-11-07 | Disposition: A | Discharge status: Home / Self Care | Payer: 59 | Source: Ambulatory Visit | Attending: Pulmonary Disease | Admitting: Pulmonary Disease

## 2021-11-07 DIAGNOSIS — R911 Solitary pulmonary nodule: Secondary | ICD-10-CM

## 2021-11-19 NOTE — Progress Notes (Signed)
Spoke with pt and notified of results per Dr. Isaiah Serge. Pt verbalized understanding. She is asking if a video visit would be okay with Dr Isaiah Serge. She is no longer having any respiratory symptoms. Is this okay?

## 2021-11-28 ENCOUNTER — Other Ambulatory Visit: Payer: Self-pay | Admitting: Cardiology

## 2021-11-28 DIAGNOSIS — I442 Atrioventricular block, complete: Secondary | ICD-10-CM

## 2021-12-28 ENCOUNTER — Other Ambulatory Visit: Payer: Self-pay

## 2021-12-28 ENCOUNTER — Encounter (HOSPITAL_COMMUNITY): Payer: Self-pay | Admitting: Family Medicine

## 2021-12-28 ENCOUNTER — Ambulatory Visit (HOSPITAL_COMMUNITY)
Admission: EM | Admit: 2021-12-28 | Discharge: 2021-12-28 | Disposition: A | Discharge status: Home / Self Care | Payer: Self-pay | Attending: Family Medicine | Admitting: Family Medicine

## 2021-12-28 DIAGNOSIS — E162 Hypoglycemia, unspecified: Secondary | ICD-10-CM | POA: Insufficient documentation

## 2021-12-28 DIAGNOSIS — R519 Headache, unspecified: Secondary | ICD-10-CM | POA: Insufficient documentation

## 2021-12-28 DIAGNOSIS — E860 Dehydration: Secondary | ICD-10-CM | POA: Insufficient documentation

## 2021-12-28 LAB — BASIC METABOLIC PANEL
Anion gap: 8 (ref 5–15)
BUN: 8 mg/dL (ref 8–23)
CO2: 29 mmol/L (ref 22–32)
Calcium: 9.9 mg/dL (ref 8.9–10.3)
Chloride: 103 mmol/L (ref 98–111)
Creatinine, Ser: 0.71 mg/dL (ref 0.44–1.00)
GFR, Estimated: >60 mL/min (ref >60)
Glucose, Bld: 87 mg/dL (ref 70–99)
Potassium: 3.9 mmol/L (ref 3.5–5.1)
Sodium: 140 mmol/L (ref 135–145)

## 2021-12-28 LAB — CBG MONITORING, ED
Glucose-Capillary: 58 mg/dL — ABNORMAL LOW (ref 70–99)
Glucose-Capillary: 81 mg/dL (ref 70–99)

## 2021-12-28 LAB — POCT URINALYSIS DIPSTICK, ED / UC
Bilirubin Urine: NEGATIVE
Glucose, UA: NEGATIVE mg/dL
Hgb urine dipstick: NEGATIVE
Ketones, ur: NEGATIVE mg/dL
Leukocytes,Ua: NEGATIVE
Nitrite: NEGATIVE
Protein, ur: NEGATIVE mg/dL
Specific Gravity, Urine: 1.02 (ref 1.005–1.030)
Urobilinogen, UA: 0.2 mg/dL (ref 0.0–1.0)
pH: 5.5 (ref 5.0–8.0)

## 2021-12-28 MED ORDER — TIZANIDINE HCL 4 MG PO TABS: 4.0000 mg | ORAL_TABLET | Freq: Four times a day (QID) | ORAL | 0 refills | Status: DC | PRN

## 2021-12-28 MED ORDER — KETOROLAC TROMETHAMINE 30 MG/ML IJ SOLN
INTRAMUSCULAR | Status: AC
  Filled 2021-12-28: qty 1

## 2021-12-28 MED ORDER — METOCLOPRAMIDE HCL 5 MG/ML IJ SOLN
INTRAMUSCULAR | Status: AC
  Filled 2021-12-28: qty 2

## 2021-12-28 MED ORDER — METOCLOPRAMIDE HCL 5 MG/ML IJ SOLN
5.0000 mg | INTRAMUSCULAR | Status: AC
  Administered 2021-12-28: 5 mg via INTRAMUSCULAR

## 2021-12-28 MED ORDER — KETOROLAC TROMETHAMINE 30 MG/ML IJ SOLN
30.0000 mg | Freq: Once | INTRAMUSCULAR | Status: AC
  Administered 2021-12-28: 30 mg via INTRAMUSCULAR

## 2021-12-28 NOTE — ED Notes (Signed)
Given graham crackers and small coke/snack requested by provider

## 2021-12-28 NOTE — ED Triage Notes (Signed)
Pt presents with headache and dizziness spells x 2 days.

## 2021-12-28 NOTE — ED Provider Notes (Addendum)
[facility]    CSN: [id number] Arrival date & time: 12/28/21  1222      History   Chief Complaint No chief complaint on file.   HPI Molly Flores is a 66 y.o. female.   HPI Patient with a medical history of Prediabetes, HTN,  Hx of heart cath, TIA presents today for evaluation of 2-day history of headache which has been present behind her eyes and moves to the back of her head. She has taken tylenol yesterday without relief. Today she felt nauseated and developed loss of appetite. She denies any associated URI symptoms. She reports dizziness occurring with positional movement and with standing. She currently takes amlodipine for her high blood pressure however did not take her medications today.  She reports she checks her blood pressure at home prior to taking her blood pressure medications and her baseline readings are 140/80s.  She has been without fever.  No known sick contacts. Past Medical History:  Diagnosis Date   Anemia    Borderline diabetes    Carotid US    [facility] 01/2019: no ICA stenosis   Dyspnea    History of cardiac catheterization    [facility] 10/2012:  no CAD   History of nuclear stress test    Myoview at [facility] 01/2019: no ischemia   History of small bowel obstruction    History of TIA (transient ischemic attack)    Hyperlipidemia    Hypertension     Patient Active Problem List   Diagnosis Date Noted   Syncope 10/14/2020   DOE (dyspnea on exertion) 10/14/2020   Chest pain, rule out acute myocardial infarction 10/14/2020   Pulmonary nodules 10/14/2020    Past Surgical History:  Procedure Laterality Date   ABDOMINAL HYSTERECTOMY     ABDOMINAL SURGERY     DIAGNOSTIC LAPAROSCOPY     LAPAROSCOPIC OOPHERECTOMY Bilateral     OB History   No obstetric history on file.      Home Medications    Prior to Admission medications   Medication Sig Start Date End Date Taking? Authorizing Provider  ALBUTEROL IN Inhale into the lungs.    [provider]  amLODipine (NORVASC) 5 MG tablet Take 1 tablet (5 mg total) by mouth daily. Patient taking differently: Take 10 mg by mouth daily. 10/23/20   Tereso Newcomer T, PA-C  Ascorbic Acid (VITAMIN C) 500 MG CAPS Take 1 capsule by mouth daily.    [provider]  aspirin 81 MG chewable tablet Chew 81 mg by mouth daily.    [provider]  cetirizine (ZYRTEC) 10 MG tablet Take 10 mg by mouth as needed for allergies.    [provider]  Cholecalciferol (VITAMIN D3) 1.25 MG (50000 UT) TABS Take 1 tablet by mouth daily.    [provider]  Ferrous Sulfate (IRON) 325 (65 Fe) MG TABS Take 1 tablet by mouth daily.    [provider]  pravastatin (PRAVACHOL) 40 MG tablet Take 40 mg by mouth daily. 10/22/20   [provider]  predniSONE (DELTASONE) 20 MG tablet Take 2 tablets (40 mg total) by mouth daily. 07/18/21   Mardella Layman, MD  vitamin B-12 (CYANOCOBALAMIN) 1000 MCG tablet Take 1,000 mcg by mouth daily.    [provider]    Family History Family History  Problem Relation Age of Onset   Diabetes Mother    Hyperlipidemia Mother    Hypertension Mother     Social History Social History  Tobacco Use   Smoking status: Never   Smokeless tobacco: Never  Vaping Use   Vaping Use: Never used  Substance Use Topics   Alcohol use: Never   Drug use: Never     Allergies   Oxycodone   Review of Systems Review of Systems Pertinent negatives listed in HPI  Physical Exam Triage Vital Signs ED Triage Vitals [12/28/21 1422]  Enc Vitals Group     BP (!) 157/94     Pulse      Resp 16     Temp 98.1 F (36.7 C)     Temp Source Oral     SpO2 96 %     Weight      Height      Head Circumference      Peak Flow      Pain Score      Pain Loc      Pain Edu?      Excl. in [place]?    No data found.  Updated Vital Signs BP (!) 157/94 (BP Location: Left Arm)    Temp 98.1 F (36.7 C) (Oral)    Resp 16    LMP  (LMP Unknown)     SpO2 96%   Visual Acuity Right Eye Distance:   Left Eye Distance:   Bilateral Distance:    Right Eye Near:   Left Eye Near:    Bilateral Near:     Physical Exam Constitutional:      Appearance: She is ill-appearing.  HENT:     Head: Normocephalic and atraumatic.     Nose: Nose normal.  Eyes:     Extraocular Movements: Extraocular movements intact.     Pupils: Pupils are equal, round, and reactive to light.  Cardiovascular:     Rate and Rhythm: Normal rate and regular rhythm.  Pulmonary:     Effort: Pulmonary effort is normal.     Breath sounds: Normal breath sounds.  Musculoskeletal:     Cervical back: Normal range of motion.  Skin:    General: Skin is warm and dry.     Capillary Refill: Capillary refill takes less than 2 seconds.  Neurological:     General: No focal deficit present.     Mental Status: She is alert and oriented to person, place, and time.  Psychiatric:        Mood and Affect: Mood normal.        Behavior: Behavior normal.        Thought Content: Thought content normal.        Judgment: Judgment normal.     UC Treatments / Results  Labs (all labs ordered are listed, but only abnormal results are displayed) Labs Reviewed  CBG MONITORING, ED - Abnormal; Notable for the following components:      Result Value   Glucose-Capillary 58 (*)    All other components within normal limits  BASIC METABOLIC PANEL  POCT URINALYSIS DIPSTICK, ED / UC    EKG SR 79 BPM, No acute ST changes    Radiology No results found.  Procedures Procedures (including critical care time)  Medications Ordered in UC Medications  ketorolac (TORADOL) 30 MG/ML injection 30 mg (30 mg Intramuscular Given 12/28/21 1510)  metoCLOPramide (REGLAN) injection 5 mg (5 mg Intramuscular Given 12/28/21 1512)    Initial Impression / Assessment and Plan / UC Course  I have reviewed the triage vital signs and the nursing notes.  Pertinent labs & imaging results that were available  during my care of the patient were reviewed by me and considered in my medical decision making (see chart for details).     Headache, questionable tension vs cluster headache. Trial headache cocktail with Toradol and Reglan. Checked blood sugar and patient is hypoglycemia with 58 blood sugar. Patient provided a snack , blood sugar rechecked now 81. Patient verbalized improvement of headache pain had improved with snack and HA cocktail. BMP unremarkable. Discussed with patient ER if HA worsens or continues to present despite medication given here today. Final Clinical Impressions(s) / UC Diagnoses   Final diagnoses:  Mild dehydration  Hypoglycemia   Discharge Instructions   None    ED Prescriptions     Medication Sig Dispense Auth. Provider   tiZANidine (ZANAFLEX) 4 MG tablet Take 1 tablet (4 mg total) by mouth every 6 (six) hours as needed for muscle spasms. 14 tablet Bing Neighbors, FNP      PDMP not reviewed this encounter.   Bing Neighbors, FNP 12/28/21 1916    Bing Neighbors, FNP 12/28/21 [phone]

## 2021-12-28 NOTE — Discharge Instructions (Addendum)
For headache pain, I have prescribed you tizanidine 4 mg take twice daily as need for headache. Avoid driving while taking medication as this medication an cause dizziness. If you headache pain has not improved or if your headache pain worsens I recommend further evaluation at the Emergency Department for more extensive work-up.

## 2022-02-17 ENCOUNTER — Other Ambulatory Visit: Payer: Self-pay | Admitting: Family Medicine

## 2022-02-17 DIAGNOSIS — Z1231 Encounter for screening mammogram for malignant neoplasm of breast: Secondary | ICD-10-CM

## 2022-04-22 ENCOUNTER — Other Ambulatory Visit: Payer: Self-pay | Admitting: Family Medicine

## 2022-04-22 DIAGNOSIS — R5381 Other malaise: Secondary | ICD-10-CM

## 2022-05-13 ENCOUNTER — Encounter (HOSPITAL_COMMUNITY): Payer: Self-pay | Admitting: Emergency Medicine

## 2022-05-13 ENCOUNTER — Ambulatory Visit (HOSPITAL_COMMUNITY)
Admission: EM | Admit: 2022-05-13 | Discharge: 2022-05-13 | Disposition: A | Discharge status: Home / Self Care | Payer: Medicare HMO | Attending: Student | Admitting: Student

## 2022-05-13 ENCOUNTER — Other Ambulatory Visit: Payer: Self-pay

## 2022-05-13 DIAGNOSIS — M5431 Sciatica, right side: Secondary | ICD-10-CM

## 2022-05-13 DIAGNOSIS — I160 Hypertensive urgency: Secondary | ICD-10-CM | POA: Diagnosis not present

## 2022-05-13 MED ORDER — PREDNISONE 20 MG PO TABS: 40.0000 mg | ORAL_TABLET | Freq: Every day | ORAL | 0 refills | Status: AC

## 2022-05-13 NOTE — ED Provider Notes (Signed)
[facility]    CSN: [id number] Arrival date & time: 05/13/22  1854      History   Chief Complaint Chief Complaint  Patient presents with   Hip Pain    HPI Molly Flores is a 66 y.o. female presenting with back pain. History R sciatica. per pt -she has experienced weeks of right hip pain radiating to the knee, nontraumatic.  She went to [facility] on 6/8, they did x-rays and managed for sciatica with 2 steroid injections per patient; I do not have access to these records.  She states this did not provide any relief.  She continues to describe pain radiating from the right buttock to the knee, this is worse with standing and sitting.  She denies any new weakness or sensation changes in the arms of the legs.  She has been referred to a physical therapist but has not established care with them yet.  Denies urinary symptoms including incontinence; denies constipation.  HPI  Past Medical History:  Diagnosis Date   Anemia    Borderline diabetes    Carotid US    [facility] 01/2019: no ICA stenosis   Dyspnea    History of cardiac catheterization    [facility] 10/2012:  no CAD   History of nuclear stress test    Myoview at [facility] 01/2019: no ischemia   History of small bowel obstruction    History of TIA (transient ischemic attack)    Hyperlipidemia    Hypertension     Patient Active Problem List   Diagnosis Date Noted   Syncope 10/14/2020   DOE (dyspnea on exertion) 10/14/2020   Chest pain, rule out acute myocardial infarction 10/14/2020   Pulmonary nodules 10/14/2020    Past Surgical History:  Procedure Laterality Date   ABDOMINAL HYSTERECTOMY     ABDOMINAL SURGERY     DIAGNOSTIC LAPAROSCOPY     LAPAROSCOPIC OOPHERECTOMY Bilateral     OB History   No obstetric history on file.      Home Medications    Prior to Admission medications   Medication Sig Start Date End Date Taking? Authorizing Provider  predniSONE (DELTASONE) 20 MG tablet Take 2 tablets (40 mg  total) by mouth daily for 5 days. Take with breakfast or lunch. Avoid NSAIDs (ibuprofen, etc) while taking this medication. 05/13/22 05/18/22 Yes Hazel Sams, PA-C  ALBUTEROL IN Inhale into the lungs.    [provider]  amLODipine (NORVASC) 5 MG tablet Take 1 tablet (5 mg total) by mouth daily. Patient taking differently: Take 10 mg by mouth daily. 10/23/20   Richardson Dopp T, PA-C  Ascorbic Acid (VITAMIN C) 500 MG CAPS Take 1 capsule by mouth daily.    [provider]  aspirin 81 MG chewable tablet Chew 81 mg by mouth daily.    [provider]  cetirizine (ZYRTEC) 10 MG tablet Take 10 mg by mouth as needed for allergies.    [provider]  Cholecalciferol (VITAMIN D3) 1.25 MG (50000 UT) TABS Take 1 tablet by mouth daily.    [provider]  Ferrous Sulfate (IRON) 325 (65 Fe) MG TABS Take 1 tablet by mouth daily.    [provider]  pravastatin (PRAVACHOL) 40 MG tablet Take 40 mg by mouth daily. 10/22/20   [provider]  tiZANidine (ZANAFLEX) 4 MG tablet Take 1 tablet (4 mg total) by mouth every 6 (six) hours as needed for muscle spasms. 12/28/21   Scot Jun, FNP  vitamin B-12 (  CYANOCOBALAMIN) 1000 MCG tablet Take 1,000 mcg by mouth daily.    [provider]    Family History Family History  Problem Relation Age of Onset   Diabetes Mother    Hyperlipidemia Mother    Hypertension Mother     Social History Social History   Tobacco Use   Smoking status: Never   Smokeless tobacco: Never  Vaping Use   Vaping Use: Never used  Substance Use Topics   Alcohol use: Never   Drug use: Never     Allergies   Oxycodone   Review of Systems Review of Systems  Musculoskeletal:  Positive for back pain.  All other systems reviewed and are negative.    Physical Exam Triage Vital Signs ED Triage Vitals  Enc Vitals Group     BP      Pulse      Resp      Temp      Temp src      SpO2      Weight       Height      Head Circumference      Peak Flow      Pain Score      Pain Loc      Pain Edu?      Excl. in [place]?    No data found.  Updated Vital Signs BP (!) 198/104 (BP Location: Left Arm) Comment (BP Location): regular cuff on forearm  Pulse 81   Temp 98.1 F (36.7 C) (Oral)   Resp (!) 22   LMP  (LMP Unknown)   SpO2 97%   Visual Acuity Right Eye Distance:   Left Eye Distance:   Bilateral Distance:    Right Eye Near:   Left Eye Near:    Bilateral Near:     Physical Exam Vitals reviewed.  Constitutional:      General: She is not in acute distress.    Appearance: Normal appearance. She is not ill-appearing.  HENT:     Head: Normocephalic and atraumatic.  Cardiovascular:     Rate and Rhythm: Normal rate and regular rhythm.     Heart sounds: Normal heart sounds.  Pulmonary:     Effort: Pulmonary effort is normal.     Breath sounds: Normal breath sounds and air entry.  Abdominal:     Tenderness: There is no abdominal tenderness. There is no right CVA tenderness, left CVA tenderness, guarding or rebound.  Musculoskeletal:     Cervical back: Normal range of motion. No swelling, deformity, signs of trauma, rigidity, spasms, tenderness, bony tenderness or crepitus. No pain with movement.     Thoracic back: No swelling, deformity, signs of trauma, spasms, tenderness or bony tenderness. Normal range of motion. No scoliosis.     Lumbar back: Spasms and tenderness present. No swelling, deformity, signs of trauma or bony tenderness. Normal range of motion. Positive right straight leg raise test. Negative left straight leg raise test. No scoliosis.     Comments: Right-sided lumbar paraspinous tenderness elicited with extension lumbar spine. No midline spinous tenderness, deformity, stepoff. Positive R straight leg raise. No saddle anesthesia. Gait intact but with pain.  Absolutely no other injury, deformity, tenderness, ecchymosis, abrasion.  Neurological:     General: No focal  deficit present.     Mental Status: She is alert.     Cranial Nerves: No cranial nerve deficit.  Psychiatric:        Mood and Affect: Mood normal.  Behavior: Behavior normal.        Thought Content: Thought content normal.        Judgment: Judgment normal.      UC Treatments / Results  Labs (all labs ordered are listed, but only abnormal results are displayed) Labs Reviewed - No data to display  EKG   Radiology No results found.  Procedures Procedures (including critical care time)  Medications Ordered in UC Medications - No data to display  Initial Impression / Assessment and Plan / UC Course  I have reviewed the triage vital signs and the nursing notes.  Pertinent labs & imaging results that were available during my care of the patient were reviewed by me and considered in my medical decision making (see chart for details).     This patient is a very pleasant 66 y.o. year old female presenting with R sciatica. No new trauma or red flag symptoms.  The patient is followed by [facility] for this chronic issue, her last visit with them was 6/8; I do not have access to these records but it appears that they administered 2 steroid injections without relief. Low-dose prednisone sent as below, she already has muscle relaxers at home. She has been referred to PT already, establish with them.  For hypertensive emergency, she has not taken her antihypertensives yet today.  Denies headaches, dizziness, vision changes.  ED return precautions discussed. Patient verbalizes understanding and agreement.   Final Clinical Impressions(s) / UC Diagnoses   Final diagnoses:  Right sided sciatica  Hypertensive urgency     Discharge Instructions      -Prednisone, 2 pills taken at the same time for 5 days in a row.  Try taking this earlier in the day as it can give you energy. Avoid NSAIDs like ibuprofen and alleve while taking this medication as they can increase your risk of  stomach upset and even GI bleeding when in combination with a steroid. You can continue tylenol (acetaminophen) up to 1000mg  3x daily. -Continue muscle relaxer as prescribed -Tylenol for pain  -Heating pad -Follow-up with [facility] for additional relief. They have a walk in urgent care similar to ours but specialized for these issues.  -Please check your blood pressure at home or at the pharmacy. If this continues to be >140/90, follow-up with your primary care provider for further blood pressure management/ medication titration. If you develop chest pain, shortness of breath, vision changes, the worst headache of your life- head straight to the ED or call 911.     ED Prescriptions     Medication Sig Dispense Auth. Provider   predniSONE (DELTASONE) 20 MG tablet Take 2 tablets (40 mg total) by mouth daily for 5 days. Take with breakfast or lunch. Avoid NSAIDs (ibuprofen, etc) while taking this medication. 10 tablet Hazel Sams, PA-C      PDMP not reviewed this encounter.   Hazel Sams, PA-C 05/13/22 2017

## 2022-05-13 NOTE — Discharge Instructions (Addendum)
-  Prednisone, 2 pills taken at the same time for 5 days in a row.  Try taking this earlier in the day as it can give you energy. Avoid NSAIDs like ibuprofen and alleve while taking this medication as they can increase your risk of stomach upset and even GI bleeding when in combination with a steroid. You can continue tylenol (acetaminophen) up to 1000mg  3x daily. -Continue muscle relaxer as prescribed -Tylenol for pain  -Heating pad -Follow-up with [facility] for additional relief. They have a walk in urgent care similar to ours but specialized for these issues.  -Please check your blood pressure at home or at the pharmacy. If this continues to be >140/90, follow-up with your primary care provider for further blood pressure management/ medication titration. If you develop chest pain, shortness of breath, vision changes, the worst headache of your life- head straight to the ED or call 911.

## 2022-05-13 NOTE — ED Triage Notes (Signed)
Right hip pain radiating to the right knee.  Denies any injury.  Pain noticed 3 days ago .  [facility] [place] [facility].  Had xrays and was told something was pressing on nerve in back, received 2 shots and no relief

## 2022-05-15 ENCOUNTER — Encounter (HOSPITAL_COMMUNITY): Payer: Self-pay

## 2022-05-15 ENCOUNTER — Emergency Department (HOSPITAL_COMMUNITY)
Admission: EM | Admit: 2022-05-15 | Discharge: 2022-05-16 | Disposition: A | Discharge status: Home / Self Care | Payer: Medicare HMO | Attending: Emergency Medicine | Admitting: Emergency Medicine

## 2022-05-15 ENCOUNTER — Other Ambulatory Visit: Payer: Self-pay

## 2022-05-15 DIAGNOSIS — D72829 Elevated white blood cell count, unspecified: Secondary | ICD-10-CM | POA: Diagnosis not present

## 2022-05-15 DIAGNOSIS — I10 Essential (primary) hypertension: Secondary | ICD-10-CM | POA: Insufficient documentation

## 2022-05-15 DIAGNOSIS — Z79899 Other long term (current) drug therapy: Secondary | ICD-10-CM | POA: Diagnosis not present

## 2022-05-15 DIAGNOSIS — M79661 Pain in right lower leg: Secondary | ICD-10-CM | POA: Insufficient documentation

## 2022-05-15 DIAGNOSIS — Z7982 Long term (current) use of aspirin: Secondary | ICD-10-CM | POA: Diagnosis not present

## 2022-05-15 DIAGNOSIS — M79606 Pain in leg, unspecified: Secondary | ICD-10-CM

## 2022-05-15 DIAGNOSIS — M79604 Pain in right leg: Secondary | ICD-10-CM | POA: Diagnosis present

## 2022-05-15 DIAGNOSIS — M79651 Pain in right thigh: Secondary | ICD-10-CM | POA: Insufficient documentation

## 2022-05-15 LAB — CBC WITH DIFFERENTIAL/PLATELET
Abs Immature Granulocytes: 0.05 10*3/uL (ref 0.00–0.07)
Basophils Absolute: 0 10*3/uL (ref 0.0–0.1)
Basophils Relative: 0 %
Eosinophils Absolute: 0 10*3/uL (ref 0.0–0.5)
Eosinophils Relative: 0 %
HCT: 37.5 % (ref 36.0–46.0)
Hemoglobin: 11.7 g/dL — ABNORMAL LOW (ref 12.0–15.0)
Immature Granulocytes: 0 %
Lymphocytes Relative: 14 %
Lymphs Abs: 1.7 10*3/uL (ref 0.7–4.0)
MCH: 25.5 pg — ABNORMAL LOW (ref 26.0–34.0)
MCHC: 31.2 g/dL (ref 30.0–36.0)
MCV: 81.9 fL (ref 80.0–100.0)
Monocytes Absolute: 0.5 10*3/uL (ref 0.1–1.0)
Monocytes Relative: 4 %
Neutro Abs: 9.5 10*3/uL — ABNORMAL HIGH (ref 1.7–7.7)
Neutrophils Relative %: 82 %
Platelets: 335 10*3/uL (ref 150–400)
RBC: 4.58 MIL/uL (ref 3.87–5.11)
RDW: 16.5 % — ABNORMAL HIGH (ref 11.5–15.5)
WBC: 11.8 10*3/uL — ABNORMAL HIGH (ref 4.0–10.5)
nRBC: 0 % (ref 0.0–0.2)

## 2022-05-15 LAB — BASIC METABOLIC PANEL
Anion gap: 11 (ref 5–15)
BUN: 16 mg/dL (ref 8–23)
CO2: 24 mmol/L (ref 22–32)
Calcium: 9.8 mg/dL (ref 8.9–10.3)
Chloride: 102 mmol/L (ref 98–111)
Creatinine, Ser: 0.73 mg/dL (ref 0.44–1.00)
GFR, Estimated: >60 mL/min (ref >60)
Glucose, Bld: 145 mg/dL — ABNORMAL HIGH (ref 70–99)
Potassium: 4.2 mmol/L (ref 3.5–5.1)
Sodium: 137 mmol/L (ref 135–145)

## 2022-05-15 LAB — D-DIMER, QUANTITATIVE: D-Dimer, Quant: 2.79 ug/mL-FEU — ABNORMAL HIGH (ref 0.00–0.50)

## 2022-05-15 NOTE — ED Triage Notes (Signed)
Pt reports pain to right leg, originates in her hip and radiates to her knee and back up her thigh. She also reports it is swollen, ongoing since June 8th and has been to two urgent cares. Received steroid injections and PO steroids and is not helping.

## 2022-05-15 NOTE — ED Provider Triage Note (Signed)
Emergency Medicine Provider Triage Evaluation Note  Molly Flores , a 66 y.o. female  was evaluated in triage.  Pt complains of right leg pain.  Pt worried that she has a blood clot  Review of Systems  Positive:  Negative:   Physical Exam  BP (!) 190/91 (BP Location: Right Arm)   Pulse 93   Temp 98.5 F (36.9 C) (Oral)   Resp (!) 24   Ht 5\' 4"  (1.626 m)   Wt 120.2 kg   LMP  (LMP Unknown)   SpO2 96%   BMI 45.49 kg/m  Gen:   Awake, no distress   Resp:  Normal effort  MSK:   Moves extremities without difficulty  Other:  Tender right calf  Medical Decision Making  Medically screening exam initiated at 7:14 PM.  Appropriate orders placed.  GUILLERMO DIFRANCESCO was informed that the remainder of the evaluation will be completed by another provider, this initial triage assessment does not replace that evaluation, and the importance of remaining in the ED until their evaluation is complete.     Ginette Pitman, Elson Areas 05/15/22 1915

## 2022-05-16 ENCOUNTER — Emergency Department (HOSPITAL_COMMUNITY): Payer: Medicare HMO

## 2022-05-16 ENCOUNTER — Emergency Department (HOSPITAL_BASED_OUTPATIENT_CLINIC_OR_DEPARTMENT_OTHER): Payer: Medicare HMO

## 2022-05-16 DIAGNOSIS — M79651 Pain in right thigh: Secondary | ICD-10-CM

## 2022-05-16 MED ORDER — HYDROCODONE-ACETAMINOPHEN 5-325 MG PO TABS: 1.0000 | ORAL_TABLET | Freq: Four times a day (QID) | ORAL | 0 refills | Status: DC | PRN

## 2022-05-16 MED ORDER — IBUPROFEN 400 MG PO TABS
400.0000 mg | ORAL_TABLET | Freq: Once | ORAL | Status: DC
  Filled 2022-05-16: qty 1

## 2022-05-16 MED ORDER — HYDROCODONE-ACETAMINOPHEN 5-325 MG PO TABS
1.0000 | ORAL_TABLET | Freq: Once | ORAL | Status: AC
  Administered 2022-05-16: 1 via ORAL
  Filled 2022-05-16: qty 1

## 2022-05-16 NOTE — Progress Notes (Signed)
Lower extremity venous right study completed.  Preliminary results relayed to Rhunette Croft, MD.  See CV Proc for preliminary results report.   Jean Rosenthal, RDMS, RVT

## 2022-05-16 NOTE — ED Provider Notes (Signed)
[facility] EMERGENCY DEPARTMENT Provider Note   CSN: [id number] Arrival date & time: 05/15/22  1839     History  Chief Complaint  Patient presents with   Leg Pain    Molly Flores is a 66 y.o. female.   Leg Pain  Patient presents with ongoing right leg pain.  She is already been seen twice for this condition and given steroids without relief.  She was initially diagnosed with sciatica but reports her symptoms are not improving.  No trauma or falls.  She reports pain throughout her right thigh and right calf.  She feels that her leg is swollen.  Denies known history of VTE.  No chest pain or shortness of breath She is not on anticoagulation.  Home Medications Prior to Admission medications   Medication Sig Start Date End Date Taking? Authorizing Provider  ALBUTEROL IN Inhale into the lungs.    [provider]  amLODipine (NORVASC) 5 MG tablet Take 1 tablet (5 mg total) by mouth daily. Patient taking differently: Take 10 mg by mouth daily. 10/23/20   Tereso Newcomer T, PA-C  Ascorbic Acid (VITAMIN C) 500 MG CAPS Take 1 capsule by mouth daily.    [provider]  aspirin 81 MG chewable tablet Chew 81 mg by mouth daily.    [provider]  cetirizine (ZYRTEC) 10 MG tablet Take 10 mg by mouth as needed for allergies.    [provider]  Cholecalciferol (VITAMIN D3) 1.25 MG (50000 UT) TABS Take 1 tablet by mouth daily.    [provider]  Ferrous Sulfate (IRON) 325 (65 Fe) MG TABS Take 1 tablet by mouth daily.    [provider]  pravastatin (PRAVACHOL) 40 MG tablet Take 40 mg by mouth daily. 10/22/20   [provider]  predniSONE (DELTASONE) 20 MG tablet Take 2 tablets (40 mg total) by mouth daily for 5 days. Take with breakfast or lunch. Avoid NSAIDs (ibuprofen, etc) while taking this medication. 05/13/22 05/18/22  Rhys Martini, PA-C  tiZANidine (ZANAFLEX) 4 MG tablet Take 1 tablet (4 mg total) by mouth  every 6 (six) hours as needed for muscle spasms. 12/28/21   Bing Neighbors, FNP  vitamin B-12 (CYANOCOBALAMIN) 1000 MCG tablet Take 1,000 mcg by mouth daily.    [provider]      Allergies    Oxycodone    Review of Systems   Review of Systems  Respiratory:  Negative for shortness of breath.   Cardiovascular:  Negative for chest pain.  Gastrointestinal:  Negative for blood in stool.    Physical Exam Updated Vital Signs BP (!) 151/69 (BP Location: Left Arm)   Pulse 65   Temp 97.8 F (36.6 C) (Oral)   Resp 17   Ht 1.626 m (5\' 4" )   Wt 120.2 kg   LMP  (LMP Unknown)   SpO2 95%   BMI 45.49 kg/m  Physical Exam CONSTITUTIONAL: Well developed/well nourished HEAD: Normocephalic/atraumatic EYES: EOMI ENMT: Mucous membranes moist NECK: supple no meningeal signs CV: S1/S2 noted LUNGS: Lungs are clear to auscultation bilaterally, no apparent distress ABDOMEN: soft, nontender, obese NEURO: Pt is awake/alert/appropriate, moves all extremitiesx4.  EXTREMITIES: pulses normal/equal, full ROM Tenderness throughout her right thigh as well as her right calf.  Distal pulses intact.  No significant overlying erythema.  No crepitus Diffuse tenderness to the right knee but no deformity.  Difficult to determine if there is an effusion present due to her body size.  There is also popliteal tenderness without thrill on the right SKIN: warm, color normal PSYCH: no abnormalities of mood noted, alert and oriented to situation  ED Results / Procedures / Treatments   Labs (all labs ordered are listed, but only abnormal results are displayed) Labs Reviewed  CBC WITH DIFFERENTIAL/PLATELET - Abnormal; Notable for the following components:      Result Value   WBC 11.8 (*)    Hemoglobin 11.7 (*)    MCH 25.5 (*)    RDW 16.5 (*)    Neutro Abs 9.5 (*)    All other components within normal limits  BASIC METABOLIC PANEL - Abnormal; Notable for the following components:   Glucose, Bld 145  (*)    All other components within normal limits  D-DIMER, QUANTITATIVE - Abnormal; Notable for the following components:   D-Dimer, Quant 2.79 (*)    All other components within normal limits    EKG None  Radiology DG Knee Right Port  Result Date: 05/16/2022 CLINICAL DATA:  Right knee pain. EXAM: PORTABLE RIGHT KNEE - 1-2 VIEW COMPARISON:  None Available. FINDINGS: Osteopenia. There is no evidence of fracture or joint effusion. There is joint space loss and osteophytes involving the medial femorotibial and patellofemoral joints, and patellar enthesopathy anteriorly. No loose bodies or radiopaque foreign bodies are seen. Surrounding soft tissues are unremarkable. IMPRESSION: Osteopenia and degenerative change without evidence of fractures. Electronically Signed   By: Almira Bar M.D.   On: 05/16/2022 05:10    Procedures Procedures    Medications Ordered in ED Medications  ibuprofen (ADVIL) tablet 400 mg (has no administration in time range)  HYDROcodone-acetaminophen (NORCO/VICODIN) 5-325 MG per tablet 1 tablet (has no administration in time range)    ED Course/ Medical Decision Making/ A&P Clinical Course as of 05/16/22 0722  Fri May 16, 2022  0425 Patient with pain throughout her right leg as well as her right knee.  Will obtain right knee x-ray.  Anticipate getting DVT study later in the morning.  No signs of trauma to her leg [DW]  0426 WBC(!): 11.8 Leukocytosis [DW]  0722 Signed out to dr Rhunette Croft to f/u on DVT study [DW]    Clinical Course User Index [DW] Zadie Rhine, MD                           Medical Decision Making Amount and/or Complexity of Data Reviewed Labs:  Decision-making details documented in ED Course. Radiology: ordered.  Risk Prescription drug management.   This patient presents to the ED for concern of right leg pain, this involves an extensive number of treatment options, and is a complaint that carries with it a high risk of complications  and morbidity.  The differential diagnosis includes but is not limited to sciatica, cellulitis, arthralgias, myalgias, deep vein thrombosis  Comorbidities that complicate the patient evaluation: Patient's presentation is complicated by their history of obesity and hypertension  Social Determinants of Health: Patient's  recent loss of her father and brother   increases the complexity of managing their presentation  Additional history obtained: Records reviewed Primary Care Documents  Lab Tests: I Ordered, and personally interpreted labs.  The pertinent results include: Elevated D-dimer which could indicate VTE  Imaging Studies ordered: I ordered imaging studies including X-ray right knee   I independently visualized and interpreted imaging which showed no acute findings I agree with the radiologist interpretation   Medicines ordered and prescription drug management: I ordered  medication including Vicodin and ibuprofen for pain Reevaluation of the patient after these medicines showed that the patient    improved   Reevaluation: After the interventions noted above, I reevaluated the patient and found that they have :improved  Complexity of problems addressed: Patient's presentation is most consistent with  acute presentation with potential threat to life or bodily function           Final Clinical Impression(s) / ED Diagnoses Final diagnoses:  None    Rx / DC Orders ED Discharge Orders     None         Zadie Rhine, MD 05/16/22 [phone]

## 2022-05-16 NOTE — ED Notes (Signed)
Patient refused ibuprofen.  States she is "on prednisone and I can't take them together."

## 2022-05-16 NOTE — Discharge Instructions (Signed)
Continue to take over-the-counter medications for pain control and follow-up with your primary care doctor.  Ultrasound did not reveal any evidence of blood clot in the ER.

## 2022-05-16 NOTE — ED Provider Notes (Signed)
  Physical Exam  BP (!) 142/66   Pulse 66   Temp 97.8 F (36.6 C) (Oral)   Resp 17   Ht 5\' 4"  (1.626 m)   Wt 120.2 kg   LMP  (LMP Unknown)   SpO2 99%   BMI 45.49 kg/m   Physical Exam  Procedures  Procedures  ED Course / MDM   Clinical Course as of 05/16/22 1233  Fri May 16, 2022  0425 Patient with pain throughout her right leg as well as her right knee.  Will obtain right knee x-ray.  Anticipate getting DVT study later in the morning.  No signs of trauma to her leg [DW]  0426 WBC(!): 11.8 Leukocytosis [DW]  0722 Signed out to dr 0723 to f/u on DVT study [DW]    Clinical Course User Index [DW] Rhunette Croft, MD   Medical Decision Making Amount and/or Complexity of Data Reviewed Labs:  Decision-making details documented in ED Course. Radiology: ordered.  Risk Prescription drug management.   Patient had come into the ER with chief complaint of right leg pain.  History of DVT.  Per Dr. Zadie Rhine, if the DVT study is negative patient can be discharged.  12:34 PM The patient appears reasonably screened and/or stabilized for discharge and I doubt any other medical condition or other [facility] requiring further screening, evaluation, or treatment in the ED at this time prior to discharge.   Results from the ER workup discussed with the patient face to face and all questions answered to the best of my ability. The patient is safe for discharge with strict return precautions.  DVT study is negative for any acute clot.       [facility], MD 05/16/22 1234

## 2022-05-30 ENCOUNTER — Ambulatory Visit: Payer: Self-pay

## 2022-06-06 ENCOUNTER — Ambulatory Visit
Admission: RE | Admit: 2022-06-06 | Discharge: 2022-06-06 | Disposition: A | Discharge status: Home / Self Care | Payer: Medicare HMO | Source: Ambulatory Visit | Attending: Family Medicine | Admitting: Family Medicine

## 2022-06-06 DIAGNOSIS — Z1231 Encounter for screening mammogram for malignant neoplasm of breast: Secondary | ICD-10-CM

## 2022-08-08 ENCOUNTER — Emergency Department (HOSPITAL_COMMUNITY): Payer: Medicare HMO

## 2022-08-08 ENCOUNTER — Emergency Department (HOSPITAL_COMMUNITY)
Admission: EM | Admit: 2022-08-08 | Discharge: 2022-08-08 | Disposition: A | Discharge status: Home / Self Care | Payer: Medicare HMO | Attending: Emergency Medicine | Admitting: Emergency Medicine

## 2022-08-08 ENCOUNTER — Encounter (HOSPITAL_COMMUNITY): Payer: Self-pay

## 2022-08-08 DIAGNOSIS — Z20822 Contact with and (suspected) exposure to covid-19: Secondary | ICD-10-CM | POA: Diagnosis not present

## 2022-08-08 DIAGNOSIS — I10 Essential (primary) hypertension: Secondary | ICD-10-CM | POA: Insufficient documentation

## 2022-08-08 DIAGNOSIS — R202 Paresthesia of skin: Secondary | ICD-10-CM | POA: Diagnosis not present

## 2022-08-08 DIAGNOSIS — Z8673 Personal history of transient ischemic attack (TIA), and cerebral infarction without residual deficits: Secondary | ICD-10-CM | POA: Insufficient documentation

## 2022-08-08 DIAGNOSIS — Z7982 Long term (current) use of aspirin: Secondary | ICD-10-CM | POA: Insufficient documentation

## 2022-08-08 DIAGNOSIS — R519 Headache, unspecified: Secondary | ICD-10-CM | POA: Insufficient documentation

## 2022-08-08 DIAGNOSIS — Z79899 Other long term (current) drug therapy: Secondary | ICD-10-CM | POA: Diagnosis not present

## 2022-08-08 LAB — RESP PANEL BY RT-PCR (FLU A&B, COVID) ARPGX2
Influenza A by PCR: NEGATIVE
Influenza B by PCR: NEGATIVE
SARS Coronavirus 2 by RT PCR: NEGATIVE

## 2022-08-08 LAB — PROTIME-INR
INR: 1.1 (ref 0.8–1.2)
Prothrombin Time: 13.9 seconds (ref 11.4–15.2)

## 2022-08-08 LAB — COMPREHENSIVE METABOLIC PANEL
ALT: 17 U/L (ref 0–44)
AST: 14 U/L — ABNORMAL LOW (ref 15–41)
Albumin: 3.6 g/dL (ref 3.5–5.0)
Alkaline Phosphatase: 57 U/L (ref 38–126)
Anion gap: 8 (ref 5–15)
BUN: 6 mg/dL — ABNORMAL LOW (ref 8–23)
CO2: 26 mmol/L (ref 22–32)
Calcium: 9.6 mg/dL (ref 8.9–10.3)
Chloride: 105 mmol/L (ref 98–111)
Creatinine, Ser: 0.73 mg/dL (ref 0.44–1.00)
GFR, Estimated: >60 mL/min (ref >60)
Glucose, Bld: 86 mg/dL (ref 70–99)
Potassium: 4.1 mmol/L (ref 3.5–5.1)
Sodium: 139 mmol/L (ref 135–145)
Total Bilirubin: 0.4 mg/dL (ref 0.3–1.2)
Total Protein: 7.4 g/dL (ref 6.5–8.1)

## 2022-08-08 LAB — DIFFERENTIAL
Abs Immature Granulocytes: 0.02 10*3/uL (ref 0.00–0.07)
Basophils Absolute: 0 10*3/uL (ref 0.0–0.1)
Basophils Relative: 1 %
Eosinophils Absolute: 0.1 10*3/uL (ref 0.0–0.5)
Eosinophils Relative: 1 %
Immature Granulocytes: 0 %
Lymphocytes Relative: 30 %
Lymphs Abs: 1.9 10*3/uL (ref 0.7–4.0)
Monocytes Absolute: 0.4 10*3/uL (ref 0.1–1.0)
Monocytes Relative: 6 %
Neutro Abs: 3.9 10*3/uL (ref 1.7–7.7)
Neutrophils Relative %: 62 %

## 2022-08-08 LAB — APTT: aPTT: 39 seconds — ABNORMAL HIGH (ref 24–36)

## 2022-08-08 LAB — RAPID URINE DRUG SCREEN, HOSP PERFORMED
Amphetamines: NOT DETECTED
Barbiturates: NOT DETECTED
Benzodiazepines: NOT DETECTED
Cocaine: NOT DETECTED
Opiates: NOT DETECTED
Tetrahydrocannabinol: NOT DETECTED

## 2022-08-08 LAB — CBC
HCT: 37.7 % (ref 36.0–46.0)
Hemoglobin: 11.5 g/dL — ABNORMAL LOW (ref 12.0–15.0)
MCH: 25.1 pg — ABNORMAL LOW (ref 26.0–34.0)
MCHC: 30.5 g/dL (ref 30.0–36.0)
MCV: 82.3 fL (ref 80.0–100.0)
Platelets: 292 10*3/uL (ref 150–400)
RBC: 4.58 MIL/uL (ref 3.87–5.11)
RDW: 16.5 % — ABNORMAL HIGH (ref 11.5–15.5)
WBC: 6.3 10*3/uL (ref 4.0–10.5)
nRBC: 0 % (ref 0.0–0.2)

## 2022-08-08 LAB — ETHANOL: Alcohol, Ethyl (B): <10 mg/dL (ref <10)

## 2022-08-08 MED ORDER — MAGNESIUM SULFATE 2 GM/50ML IV SOLN
2.0000 g | Freq: Once | INTRAVENOUS | Status: AC
Start: 2022-08-08 — End: 2022-08-08
  Administered 2022-08-08: 2 g via INTRAVENOUS
  Filled 2022-08-08: qty 50

## 2022-08-08 MED ORDER — MORPHINE SULFATE (PF) 4 MG/ML IV SOLN
4.0000 mg | Freq: Once | INTRAVENOUS | Status: AC
  Administered 2022-08-08: 4 mg via INTRAVENOUS
  Filled 2022-08-08: qty 1

## 2022-08-08 MED ORDER — KETOROLAC TROMETHAMINE 15 MG/ML IJ SOLN
15.0000 mg | Freq: Once | INTRAMUSCULAR | Status: AC
Start: 2022-08-08 — End: 2022-08-08
  Administered 2022-08-08: 15 mg via INTRAVENOUS
  Filled 2022-08-08: qty 1

## 2022-08-08 MED ORDER — HYDROCODONE-ACETAMINOPHEN 5-325 MG PO TABS: 2.0000 | ORAL_TABLET | Freq: Four times a day (QID) | ORAL | 0 refills | Status: AC | PRN

## 2022-08-08 MED ORDER — SODIUM CHLORIDE 0.9 % IV BOLUS
500.0000 mL | Freq: Once | INTRAVENOUS | Status: AC
  Administered 2022-08-08: 500 mL via INTRAVENOUS

## 2022-08-08 NOTE — Discharge Instructions (Signed)
Keep your appointment with ophthalmology for evaluation of the left eye in 4 days.

## 2022-08-08 NOTE — ED Provider Notes (Signed)
[facility] EMERGENCY DEPARTMENT Provider Note   CSN: [id number] Arrival date & time: 08/08/22  1343     History  Chief Complaint  Patient presents with   Headache   Numbness    Molly Flores is a 66 y.o. female.  Pt is a 66 yo female with pmh of HTN, hyperlipidemia, obesity, and TIA presenting for headache. Pt admits to left sided headache in the forehead and back of the head, constant, severe, gradual, x 3-4 days. New numbness/tingling sensation in the left upper extremity. Denies weakness, facial assymtry, speech changes, or leg changes.Pt also admits to strange twitching sensation in left eye. Denies vision deficits or pain. 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 No reoccurrences since. No blood thinners. On daily ASA. States she does not typically get migraines or headaches. No improvement with Tylenol at the house.   The history is provided by the patient. No language interpreter was used.  Headache Associated symptoms: numbness   Associated symptoms: no abdominal pain, no back pain, no cough, no ear pain, no eye pain, no fever, no seizures, no sore throat, no vomiting and no weakness        Home Medications Prior to Admission medications   Medication Sig Start Date End Date Taking? Authorizing Provider  albuterol (VENTOLIN HFA) 108 (90 Base) MCG/ACT inhaler Inhale 2 puffs into the lungs daily as needed for wheezing or shortness of breath. 11/25/21  Yes [provider]  amLODipine (NORVASC) 10 MG tablet Take 10 mg by mouth daily. 04/18/22  Yes [provider]  Ascorbic Acid (VITAMIN C) 500 MG CAPS Take 500 mg by mouth daily.   Yes [provider]  aspirin 81 MG chewable tablet Chew 81 mg by mouth daily.   Yes [provider]  cholecalciferol (VITAMIN D3) 25 MCG (1000 UNIT) tablet Take 1,000 Units by  mouth daily.   Yes [provider]  Ferrous Sulfate (IRON) 325 (65 Fe) MG TABS Take 325 mg by mouth daily.   Yes [provider]  Fexofenadine HCl (ALLEGRA ALLERGY PO) Take 1 tablet by mouth daily.   Yes [provider]  fluticasone (FLONASE) 50 MCG/ACT nasal spray Place 2 sprays into both nostrils daily as needed for allergies.   Yes [provider]  HYDROcodone-acetaminophen (NORCO/VICODIN) 5-325 MG tablet Take 2 tablets by mouth every 6 (six) hours as needed for up to 3 days. 08/08/22 08/11/22 Yes Edwin Dada P, DO  magnesium 30 MG tablet Take 30 mg by mouth daily.   Yes [provider]  pravastatin (PRAVACHOL) 80 MG tablet Take 80 mg by mouth at bedtime.   Yes [provider]  tiZANidine (ZANAFLEX) 4 MG tablet Take 1 tablet (4 mg total) by mouth every 6 (six) hours as needed for muscle spasms. 12/28/21  Yes Bing Neighbors, FNP  vitamin B-12 (CYANOCOBALAMIN) 1000 MCG tablet Take 1,000 mcg by mouth daily.   Yes [provider]  amLODipine (NORVASC) 5 MG tablet Take 1 tablet (5 mg total) by mouth daily. Patient not taking: Reported on 05/16/2022 10/23/20   Tereso Newcomer T, PA-C      Allergies    Oxycodone    Review of Systems   Review of Systems  Constitutional:  Negative for chills and fever.  HENT:  Negative for ear pain and sore throat.   Eyes:  Negative for pain and visual disturbance.  Respiratory:  Negative for cough and shortness of breath.   Cardiovascular:  Negative for chest pain and palpitations.  Gastrointestinal:  Negative for abdominal pain and vomiting.  Genitourinary:  Negative for dysuria and hematuria.  Musculoskeletal:  Negative for arthralgias and back pain.  Skin:  Negative for color change and rash.  Neurological:  Positive for numbness and headaches. Negative for seizures, syncope, speech difficulty and weakness.  All other systems reviewed and are negative.   Physical Exam Updated Vital Signs BP  (!) 150/85   Pulse 75   Temp 98.1 F (36.7 C) (Oral)   Resp 14   Ht 5\' 4"  (1.626 m)   Wt 120.2 kg   LMP  (LMP Unknown)   SpO2 100%   BMI 45.49 kg/m  Physical Exam Vitals and nursing note reviewed.  Constitutional:      General: She is not in acute distress.    Appearance: She is well-developed.  HENT:     Head: Normocephalic and atraumatic.  Eyes:     General: Lids are normal. Vision grossly intact.     Conjunctiva/sclera: Conjunctivae normal.     Pupils: Pupils are equal, round, and reactive to light.  Cardiovascular:     Rate and Rhythm: Normal rate and regular rhythm.     Heart sounds: No murmur heard. Pulmonary:     Effort: Pulmonary effort is normal. No respiratory distress.     Breath sounds: Normal breath sounds.  Abdominal:     Palpations: Abdomen is soft.     Tenderness: There is no abdominal tenderness.  Musculoskeletal:        General: No swelling.     Cervical back: Neck supple.  Skin:    General: Skin is warm and dry.     Capillary Refill: Capillary refill takes less than 2 seconds.  Neurological:     Mental Status: She is alert and oriented to person, place, and time.     GCS: GCS eye subscore is 4. GCS verbal subscore is 5. GCS motor subscore is 6.     Cranial Nerves: Cranial nerves 2-12 are intact.     Sensory: Sensation is intact.     Motor: Motor function is intact.     Coordination: Coordination is intact.  Psychiatric:        Mood and Affect: Mood normal.     ED Results / Procedures / Treatments   Labs (all labs ordered are listed, but only abnormal results are displayed) Labs Reviewed  APTT - Abnormal; Notable for the following components:      Result Value   aPTT 39 (*)    All other components within normal limits  CBC - Abnormal; Notable for the following components:   Hemoglobin 11.5 (*)    MCH 25.1 (*)    RDW 16.5 (*)    All other components within normal limits  COMPREHENSIVE METABOLIC PANEL - Abnormal; Notable for the following  components:   BUN 6 (*)    AST 14 (*)    All other components within normal limits  RESP PANEL BY RT-PCR (FLU A&B, COVID) ARPGX2  ETHANOL  PROTIME-INR  DIFFERENTIAL  RAPID URINE DRUG SCREEN, HOSP PERFORMED  URINALYSIS, ROUTINE W REFLEX MICROSCOPIC  I-STAT CHEM 8, ED    EKG None  Radiology MR BRAIN WO CONTRAST  Result Date: 08/08/2022 CLINICAL DATA:  Headache, left-sided numbness EXAM: MRI HEAD WITHOUT CONTRAST TECHNIQUE: Multiplanar, multiecho pulse sequences of the brain and surrounding  structures were obtained without intravenous contrast. COMPARISON:  No prior MRI, correlation is made with CT head 08/08/2022 FINDINGS: Brain: No restricted diffusion to suggest acute or subacute infarct. No acute hemorrhage, mass, mass effect, or midline shift. No hemosiderin deposition to suggest remote hemorrhage. No hydrocephalus or extra-axial collection. Scattered T2 hyperintense signal in the periventricular white matter, likely the sequela of mild chronic small vessel ischemic disease. Normal pituitary and craniocervical junction. Vascular: Normal arterial flow voids. Skull and upper cervical spine: Normal marrow signal. Sinuses/Orbits: No acute finding. Other: The mastoids are well aerated. IMPRESSION: No acute intracranial process. No evidence of acute or subacute infarct. Electronically Signed   By: Wiliam Ke M.D.   On: 08/08/2022 21:21   CT HEAD WO CONTRAST  Result Date: 08/08/2022 CLINICAL DATA:  Neurological deficit.  Left-sided numbness. EXAM: CT HEAD WITHOUT CONTRAST TECHNIQUE: Contiguous axial images were obtained from the base of the skull through the vertex without intravenous contrast. RADIATION DOSE REDUCTION: This exam was performed according to the departmental dose-optimization program which includes automated exposure control, adjustment of the mA and/or kV according to patient size and/or use of iterative reconstruction technique. COMPARISON:  None Available. FINDINGS: Brain: No  evidence of acute infarction, hemorrhage, hydrocephalus, extra-axial collection or mass lesion/mass effect. Vascular: No hyperdense vessel or unexpected calcification. Skull: No osseous abnormality. Sinuses/Orbits: Visualized paranasal sinuses are clear. Visualized mastoid sinuses are clear. Visualized orbits demonstrate no focal abnormality. Other: None IMPRESSION: 1. No acute intracranial abnormality. Electronically Signed   By: Elige Ko M.D.   On: 08/08/2022 15:08    Procedures Procedures    Medications Ordered in ED Medications  sodium chloride 0.9 % bolus 500 mL (0 mLs Intravenous Stopped 08/08/22 1758)  ketorolac (TORADOL) 15 MG/ML injection 15 mg (15 mg Intravenous Given 08/08/22 1720)  morphine (PF) 4 MG/ML injection 4 mg (4 mg Intravenous Given 08/08/22 1721)  magnesium sulfate IVPB 2 g 50 mL (0 g Intravenous Stopped 08/08/22 1835)    ED Course/ Medical Decision Making/ A&P                           Medical Decision Making Amount and/or Complexity of Data Reviewed Radiology: ordered.  Risk Prescription drug management.   66 yo female with pmh of HTN, hyperlipidemia, obesity, and TIA presenting for headache with left arm paresthesias x 3-4 days.  Patient is alert and oriented x3, no acute distress, afebrile, stable vital signs.  Physical exam demonstrates no neurovascular deficits.  Sensation intact in the left upper extremity.  Stroke activation at this time due to patient being outside of time window.  CT head demonstrates no acute process.  Patient given medication for headache management.  Patient also admits to twisting sensation in the left eye.  Gross vision intact.  PERRLA.  No signs of trauma or swelling.  MRI demonstrates no acute process.  Patient states all symptoms have resolved after headache cocktail.  Recommended for close follow-up with pneumology for evaluation of the left eye.  Patient states she has appointment in 4 days.  Patient in no distress and overall  condition improved here in the ED. Detailed discussions were had with the patient regarding current findings, and need for close f/u with PCP or on call doctor. The patient has been instructed to return immediately if the symptoms worsen in any way for re-evaluation. Patient verbalized understanding and is in agreement with current care plan. All questions answered prior to discharge.  Final Clinical Impression(s) / ED Diagnoses Final diagnoses:  Nonintractable headache, unspecified chronicity pattern, unspecified headache type    Rx / DC Orders ED Discharge Orders          Ordered    HYDROcodone-acetaminophen (NORCO/VICODIN) 5-325 MG tablet  Every 6 hours PRN        08/08/22 2143              Franne Forts, DO 08/08/22 2143

## 2022-08-08 NOTE — ED Notes (Signed)
Patient verbalizes understanding of d/c instructions. Opportunities for questions and answers were provided. Pt d/c from ED and wheeled to lobby.  

## 2022-08-08 NOTE — ED Triage Notes (Addendum)
Pt states she has been having L sided headache that waxes and wanes for 2-3 days. Pt also c/o L sided paresthesia for one day. No unilateral weakness noted during triage. Pt reports hx of TIA, no anticoagulation.

## 2022-08-08 NOTE — ED Provider Triage Note (Signed)
Emergency Medicine Provider Triage Evaluation Note  Molly Flores , a 66 y.o. female  was evaluated in triage.  Pt complains of headache, left-sided numbness.  Patient states that headache has been present for approximately the past week.  It is left and poorly located with radiation down her left neck.  She also endorses left eye pain without visual changes in bilateral eye.  She secondarily endorses left-sided facial numbness as well as left-sided upper extremity numbness that has gotten increasingly worse over the past 2 to 3 days.  She has a history of TIA.  She states she takes aspirin daily but no other anticoagulation/antiplatelet.  Denies fever, chills, night sweats, chest pain, shortness of breath, abdominal pain, urinary symptoms, change in bowel habits.  Denies slurred speech, gait abnormalities, weakness, facial droop.  Review of Systems  Positive: See above Negative:   Physical Exam  BP (!) 172/84 (BP Location: Right Arm)   Pulse 86   Temp 98 F (36.7 C) (Oral)   Resp 17   Ht 5\' 4"  (1.626 m)   Wt 120.2 kg   LMP  (LMP Unknown)   SpO2 98%   BMI 45.49 kg/m  Gen:   Awake, no distress   Resp:  Normal effort  MSK:   Moves extremities without difficulty  Other:  Left-sided facial paresthesia as well as left upper extremity paresthesia.  Grip strength decreased on left when compared to right.  Otherwise, cranial nerves III through XII grossly intact.  PERRLA bilaterally.  EOMs intact.  Medical Decision Making  Medically screening exam initiated at 2:09 PM.  Appropriate orders placed.  Molly Flores was informed that the remainder of the evaluation will be completed by another provider, this initial triage assessment does not replace that evaluation, and the importance of remaining in the ED until their evaluation is complete.     Ginette Pitman, Peter Garter 08/08/22 [phone]

## 2022-08-08 NOTE — ED Notes (Signed)
Patient transported to MRI 

## 2022-09-12 ENCOUNTER — Ambulatory Visit (INDEPENDENT_AMBULATORY_CARE_PROVIDER_SITE_OTHER): Payer: Medicare HMO | Admitting: Physician Assistant

## 2022-09-12 ENCOUNTER — Ambulatory Visit (INDEPENDENT_AMBULATORY_CARE_PROVIDER_SITE_OTHER): Payer: Medicare HMO

## 2022-09-12 ENCOUNTER — Encounter: Payer: Self-pay | Admitting: Physician Assistant

## 2022-09-12 DIAGNOSIS — M25551 Pain in right hip: Secondary | ICD-10-CM | POA: Diagnosis not present

## 2022-09-12 NOTE — Progress Notes (Signed)
Office Visit Note   Patient: Molly Flores           Date of Birth: Apr 14, 1956           MRN: [id number] Visit Date: 09/12/2022              Requested by: No referring provider defined for this encounter. PCP: Ephriam Jenkins, PA (Inactive)  Chief Complaint  Patient presents with   Lower Back - Pain   Right Hip - Pain      HPI: [name] is a pleasant 66 year old woman with a history of low back pain and right hip pain.  This has been going on for about a month.  She said it hurts all the time she notices a lot when she is driving or when she has been sitting for a while makes it worse.  She does have some intermittent mitten numbness and tingling in her leg.  Denies any loss of bowel or bladder control.  She has previously tried treatment including steroid which did not help her much she cannot take anti-inflammatories because of her blood pressure.  She was given physical therapy to do but she went once and then they canceled several sessions on her.  She also says she has sensitivity in her legs and calfs even to light touch.  Sometimes she does not tolerate sheet laying on her  Assessment & Plan: Visit Diagnoses:  1. Pain in right hip     Plan: Patient actually has a longer history of low back pain.  She just has some radicular findings.  Could consider an MRI however she is interested in first trying physical therapy.  I think this could be quite helpful to her.  We will set her up for physical therapy she understands if she does not get better she can contact me and we could order an MRI  Follow-Up Instructions: No follow-ups on file.   Ortho Exam  Patient is alert, oriented, no adenopathy, well-dressed, normal affect, normal respiratory effort. Examination of her low back and right hip she has no pain with manipulation of her hip she has flexion extension of her back side to side bending does not cause too much pain she has 5 out of 5 strength with resisted dorsiflexion  plantarflexion of her ankles flexion extension of her legs and hips.  She does not have any groin pain with manipulation of her hip distal sensation and motor is intact compartments are soft and nontender negative Bevelyn Buckles' sign bilaterally she does have some sensitivity to just light touch in her lower legs  Imaging: XR Lumbar Spine 2-3 Views  Result Date: 09/12/2022 2 views of the lumbar spine were reviewed today.  Well-maintained alignment without any listhesis overall well-preserved joint spaces no evidence of any fracture or other osseous abnormalities  XR HIP UNILAT W OR W/O PELVIS 2-3 VIEWS RIGHT  Result Date: 09/12/2022 Radiographs of the right hip were reviewed today.  Well-maintained alignment femoral head is well-seated in the acetabulum may be some early sclerotic changes but no significant osteophyte formation or advanced degenerative changes  No images are attached to the encounter.  Labs: Lab Results  Component Value Date   HGBA1C 6.4 (H) 10/14/2020   REPTSTATUS 03/07/2021 FINAL 03/06/2021   CULT MODERATE STREPTOCOCCUS,BETA HEMOLYTIC NOT GROUP A 03/06/2021     Lab Results  Component Value Date   ALBUMIN 3.6 08/08/2022   ALBUMIN 3.6 10/13/2020    No results found for: "MG" No results  found for: "VD25OH"  No results found for: "PREALBUMIN"    Latest Ref Rng & Units 08/08/2022    2:16 PM 05/15/2022    7:32 PM 10/15/2020    3:45 AM  CBC EXTENDED  WBC 4.0 - 10.5 K/uL 6.3  11.8  4.9   RBC 3.87 - 5.11 MIL/uL 4.58  4.58  4.00   Hemoglobin 12.0 - 15.0 g/dL 11.5  11.7  10.1   HCT 36.0 - 46.0 % 37.7  37.5  33.9   Platelets 150 - 400 K/uL 292  335  240   NEUT# 1.7 - 7.7 K/uL 3.9  9.5  2.6   Lymph# 0.7 - 4.0 K/uL 1.9  1.7  1.7      There is no height or weight on file to calculate BMI.  Orders:  Orders Placed This Encounter  Procedures   XR Lumbar Spine 2-3 Views   XR HIP UNILAT W OR W/O PELVIS 2-3 VIEWS RIGHT   Ambulatory referral to Physical Therapy   No  orders of the defined types were placed in this encounter.    Procedures: No procedures performed  Clinical Data: No additional findings.  ROS:  All other systems negative, except as noted in the HPI. Review of Systems  Objective: Vital Signs: LMP  (LMP Unknown)   Specialty Comments:  No specialty comments available.  PMFS History: Patient Active Problem List   Diagnosis Date Noted   Syncope 10/14/2020   DOE (dyspnea on exertion) 10/14/2020   Chest pain, rule out acute myocardial infarction 10/14/2020   Pulmonary nodules 10/14/2020   Past Medical History:  Diagnosis Date   Anemia    Borderline diabetes    Carotid US    [facility] 01/2019: no ICA stenosis   Dyspnea    History of cardiac catheterization    [facility] 10/2012:  no CAD   History of nuclear stress test    [facility]oview at [facility] 01/2019: no ischemia   History of small bowel obstruction    History of TIA (transient ischemic attack)    Hyperlipidemia    Hypertension     Family History  Problem Relation Age of Onset   Diabetes Mother    Hyperlipidemia Mother    Hypertension Mother     Past Surgical History:  Procedure Laterality Date   ABDOMINAL HYSTERECTOMY     ABDOMINAL SURGERY     DIAGNOSTIC LAPAROSCOPY     LAPAROSCOPIC OOPHERECTOMY Bilateral    Social History   Occupational History   Occupation: Nanny  Tobacco Use   Smoking status: Never   Smokeless tobacco: Never  Vaping Use   Vaping Use: Never used  Substance and Sexual Activity   Alcohol use: Never   Drug use: Never   Sexual activity: Not Currently    Birth control/protection: Abstinence

## 2022-09-17 ENCOUNTER — Ambulatory Visit: Payer: Medicare HMO | Admitting: Physical Therapy

## 2022-09-17 NOTE — Therapy (Signed)
OUTPATIENT PHYSICAL THERAPY  EVALUATION   Patient Name: Molly Flores MRN: [id number] DOB:01/20/1956, 66 y.o., female Today's Date: 09/18/2022   PT End of Session - 09/18/22 1101     Visit Number 1    Number of Visits 13    Date for PT Re-Evaluation 11/08/22   extending POC for 7 weeks as patient will be out of town for a week during POC   Authorization Type [employer]- requesting auth    PT Start Time 1103    PT Stop Time 1142    PT Time Calculation (min) 39 min    Activity Tolerance Patient tolerated treatment well    Behavior During Therapy [facility] for tasks assessed/performed             Past Medical History:  Diagnosis Date   Anemia    Borderline diabetes    Carotid US    [facility] 01/2019: no ICA stenosis   Dyspnea    History of cardiac catheterization    [facility] 10/2012:  no CAD   History of nuclear stress test    Myoview at [facility] 01/2019: no ischemia   History of small bowel obstruction    History of TIA (transient ischemic attack)    Hyperlipidemia    Hypertension    Past Surgical History:  Procedure Laterality Date   ABDOMINAL HYSTERECTOMY     ABDOMINAL SURGERY     DIAGNOSTIC LAPAROSCOPY     LAPAROSCOPIC OOPHERECTOMY Bilateral    Patient Active Problem List   Diagnosis Date Noted   Syncope 10/14/2020   DOE (dyspnea on exertion) 10/14/2020   Chest pain, rule out acute myocardial infarction 10/14/2020   Pulmonary nodules 10/14/2020    PCP: Adrienne Mocha, PA  REFERRING PROVIDER: Persons, [place], PA  REFERRING DIAG: [phone] (ICD-10-CM) - Pain in right hip  THERAPY DIAG:  Other low back pain  Pain in right hip  Muscle weakness (generalized)  Rationale for Evaluation and Treatment Rehabilitation  ONSET DATE: chronic   SUBJECTIVE:   SUBJECTIVE STATEMENT: Patient reports her Rt hip pain and low back pain started around last November when she had to sleep in a recliner at the hospital while her dad was a patient. And then in May she had a flare up  of pain and received an injection in her hip that helped for a little while, but then the pain came back. She feels as though the pain is getting worse especially when she is driving which she does a lot for work and to see her family. She reports sometimes the hip is sore to touch, but other times the pain feels like it is deep in the hip. She reports occasional tingling about the RLE that can course along the lateral or medial aspect to her toes. She denies any changes in bowel/bladder.   PERTINENT HISTORY: TIA Hypertension    PAIN:  Are you having pain? Yes: NPRS scale: 8/10 Pain location: low back and Rt posterior hip  Pain description: dull Aggravating factors: driving, cleaning Relieving factors: rest  PRECAUTIONS: None  WEIGHT BEARING RESTRICTIONS No  FALLS:  Has patient fallen in last 6 months? No  LIVING ENVIRONMENT: Lives with: lives with their family Lives in: House/apartment Stairs: No Has following equipment at home: None  OCCUPATION: nanny  PLOF: Independent  PATIENT GOALS "Better, no pain; I can do what I need to do and not be hurting."    OBJECTIVE:   DIAGNOSTIC FINDINGS:  Rt Hip X-ray: Well-maintained  alignment femoral  head is well-seated in the acetabulum may be some early  sclerotic changes but no significant osteophyte formation or advanced  degenerative changes.   Lumbar X-ray:  Well-maintained  alignment without any listhesis overall well-preserved joint spaces no  evidence of any fracture or other osseous abnormalities  PATIENT SURVEYS:  FOTO Hip 40% to 53%; Lumbar 47% to 56%  COGNITION:  Overall cognitive status: Within functional limits for tasks assessed     SENSATION: WFL  MUSCLE LENGTH: Hamstrings: WNL Piriformis: moderate tightness bilaterally    POSTURE: anterior pelvic tilt  PALPATION: TTP Rt greater trochanter and gluteals (tautness);  LUMBAR ROM:   Active  A/PROM  eval  Flexion WNL pain posterior Rt hip  Extension  WNL pain in low back   Right lateral flexion 25% limited LBP  Left lateral flexion 25% limited LBP  Right rotation WNL  Left rotation WNL   (Blank rows = not tested)  LOWER EXTREMITY ROM:  Active ROM Right eval Left eval  Hip flexion WNL pain in low back WNL  Hip extension    Hip abduction    Hip adduction    Hip internal rotation    Hip external rotation    Knee flexion    Knee extension    Ankle dorsiflexion    Ankle plantarflexion    Ankle inversion    Ankle eversion     (Blank rows = not tested)  LOWER EXTREMITY MMT:  MMT Right eval Left eval  Hip flexion 5 5  Hip extension 3- 3-  Hip abduction 3+ 3-  Hip adduction    Hip internal rotation 5 5  Hip external rotation 5 5  Knee flexion    Knee extension    Ankle dorsiflexion    Ankle plantarflexion    Ankle inversion    Ankle eversion     (Blank rows = not tested)  SPECIAL TESTS:  (+) SLR (+) FABER (+) FADIR  FUNCTIONAL TESTS:  N/A  GAIT: Distance walked: 10 ft Assistive device utilized: None Level of assistance: Complete Independence Comments: foot ER, limited hip extension/flexion during gait cycle     TODAY'S TREATMENT: [facility] Adult PT Treatment:                                                DATE: 09/18/22 Therapeutic Exercise: Demonstrated and issue initial HEP.   Therapeutic Activity: Education on assessment findings that will be addressed throughout duration of POC.       PATIENT EDUCATION:  Education details: see treatment  Person educated: Patient Education method: Explanation, Demonstration, Tactile cues, Verbal cues, and Handouts Education comprehension: verbalized understanding, returned demonstration, verbal cues required, tactile cues required, and needs further education   HOME EXERCISE PROGRAM: Access Code: [email or web address] URL: [email or web address] Date: 09/18/2022 Prepared by: Gwendolyn Grant  Exercises - Supine Lower Trunk Rotation  - 2 x daily - 7 x weekly  - 2 sets - 10 reps - Modified Thomas Stretch  - 2 x daily - 7 x weekly - 3 sets - 30 sec hold - Supine Sciatic Nerve Glide  - 2 x daily - 7 x weekly - 1 sets - 10 reps - Supine Hip External Rotation Stretch  - 2 x daily - 7 x weekly - 3 sets - 30 sec  hold - Seated Hip Abduction with Resistance  - 2 x daily -  7 x weekly - 2 sets - 10 reps  ASSESSMENT:  CLINICAL IMPRESSION: Patient is a 66 y.o. female who was seen today for physical therapy evaluation and treatment for chronic low back and Rt hip pain. Upon assessment she is noted to have limited lateral trunk flexion as well as pain reported with trunk extension, flexion, and lateral flexion AROM. She has significant bilateral hip weakness as well as tautness and palpable tenderness present about posterior hip musculature. She will benefit from skilled PT to address the above stated deficits in order to optimize her function and assist in overall pain reduction.    OBJECTIVE IMPAIRMENTS decreased activity tolerance, decreased knowledge of condition, difficulty walking, decreased ROM, decreased strength, improper body mechanics, postural dysfunction, and pain.   ACTIVITY LIMITATIONS carrying, lifting, bending, sitting, standing, squatting, and locomotion level  PARTICIPATION LIMITATIONS: cleaning, driving, shopping, and community activity  PERSONAL FACTORS Age, Fitness, Profession, Time since onset of injury/illness/exacerbation, and 1-2 comorbidities: hypertension, TIA  are also affecting patient's functional outcome.   REHAB POTENTIAL: Fair chronicity of injury and other personal factors  CLINICAL DECISION MAKING: Stable/uncomplicated  EVALUATION COMPLEXITY: Low   GOALS: Goals reviewed with patient? Yes  SHORT TERM GOALS: Target date: 10/09/22 Patient will be independent and compliant with initial HEP.  Baseline: issued at eval  Goal status: INITIAL  2.  Patient will report pain as </=5/10 to reduce current functional limitations.   Baseline: 10/10 at worst  Goal status: INITIAL  3.  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  Baseline: increased pain  Goal status: INITIAL   LONG TERM GOALS: Target date: 11/06/22  Patient will report minimal difficulty with household cleaning as it relates to her back/hip.  Baseline: significant difficulty (requires seated rest breaks) Goal status: INITIAL  2.  Patient will demonstrate at least 4-/5 bilateral hip abductor and extensor strength to improve stability about the chain with prolonged standing and walking activity.  Baseline: see above  Goal status: INITIAL  3.  Patient will score at least 53% hip and 56% lumbar on FOTO to signify clinically meaningful improvement in functional abilities.   Baseline: see above  Goal status: INITIAL  4.  Patient will be independent with advanced home program to progress/maintain current level of function.  Baseline: n/a Goal status: INITIAL    PLAN: PT FREQUENCY: 1-2x/week  PT DURATION: 6 weeks  PLANNED INTERVENTIONS: Therapeutic exercises, Therapeutic activity, Neuromuscular re-education, Balance training, Gait training, Patient/Family education, Self Care, Aquatic Therapy, Dry Needling, Spinal mobilization, Cryotherapy, Moist heat, Manual therapy, and Re-evaluation  PLAN FOR NEXT SESSION: review HEP; core/hip strengthening, lifting eduction   Letitia Libra, PT, DPT, ATC 09/18/22 3:17 PM  Referring diagnosis? M25.551 (ICD-10-CM) - Pain in right hip Treatment diagnosis? (if different than referring diagnosis)  Other low back pain  Pain in right hip  Muscle weakness (generalized)  What was this (referring dx) caused by? []  Surgery []  Fall [x]  Ongoing issue []  Arthritis []  Other: ____________  Laterality: [x]  Rt []  Lt []  Both  Check all possible CPT codes:  *CHOOSE 10 OR LESS*    []  97110 (Therapeutic  Exercise)  []  92507 (SLP Treatment)  []  97112 (Neuro Re-ed)   []  92526 (Swallowing Treatment)   []  97116 (Gait Training)   []  (Cognitive Training, 1st 15 minutes) []  97140 (Manual Therapy)   []  97130 (Cognitive Training, each add'l 15 minutes)  []  (Re-evaluation)                              []   Other, List CPT Code ____________  []  97530 (Therapeutic Activities)     []  97535 (Self Care)   [x]  All codes above (97110 - 97535)  []  (Mechanical Traction)  []  97014 (E-stim Unattended)  []  97032 (E-stim manual)  []  97033 (Ionto)  []  97035 (Ultrasound) []  97750 (Physical Performance Training) [x]  (Aquatic Therapy) []  97016 (Vasopneumatic Device) []  (Paraffin) []  97034 (Contrast Bath) []  97597 (Wound Care 1st 20 sq cm) []  97598 (Wound Care each add'l 20 sq cm) []  97760 (Orthotic Fabrication, Fitting, Training Initial) []  (Prosthetic Management and Training Initial) []  71165 (Orthotic or Prosthetic Training/ Modification Subsequent)

## 2022-09-18 ENCOUNTER — Other Ambulatory Visit: Payer: Self-pay

## 2022-09-18 ENCOUNTER — Ambulatory Visit: Payer: Medicare HMO | Attending: Physician Assistant

## 2022-09-18 DIAGNOSIS — M5459 Other low back pain: Secondary | ICD-10-CM | POA: Diagnosis present

## 2022-09-18 DIAGNOSIS — M6281 Muscle weakness (generalized): Secondary | ICD-10-CM | POA: Insufficient documentation

## 2022-09-18 DIAGNOSIS — M25551 Pain in right hip: Secondary | ICD-10-CM | POA: Diagnosis present

## 2022-09-24 ENCOUNTER — Ambulatory Visit: Payer: Medicare HMO

## 2022-10-01 ENCOUNTER — Ambulatory Visit: Payer: Medicare HMO

## 2022-10-03 ENCOUNTER — Ambulatory Visit: Payer: Medicare HMO

## 2022-10-08 ENCOUNTER — Ambulatory Visit: Payer: Medicare HMO | Attending: Physician Assistant

## 2022-10-08 DIAGNOSIS — M5459 Other low back pain: Secondary | ICD-10-CM | POA: Insufficient documentation

## 2022-10-08 DIAGNOSIS — M6281 Muscle weakness (generalized): Secondary | ICD-10-CM | POA: Diagnosis present

## 2022-10-08 DIAGNOSIS — M25551 Pain in right hip: Secondary | ICD-10-CM | POA: Insufficient documentation

## 2022-10-08 NOTE — Therapy (Signed)
OUTPATIENT PHYSICAL THERAPY TREATMENT NOTE   Patient Name: Molly Flores MRN: [id number] DOB:1956/03/24, 66 y.o., female Today's Date: 10/09/2022  PCP: Adrienne Mocha, PA  REFERRING PROVIDER:  Persons, [place], [place]   END OF SESSION:   PT End of Session - 10/08/22 1704     Visit Number 2    Number of Visits 13    Date for PT Re-Evaluation 11/08/22   extending POC for 7 weeks as patient will be out of town for a week during POC   Authorization Type [employer]- requesting auth    PT Start Time 1703    PT Stop Time 1743    PT Time Calculation (min) 40 min    Activity Tolerance Patient tolerated treatment well    Behavior During Therapy [facility] for tasks assessed/performed             Past Medical History:  Diagnosis Date   Anemia    Borderline diabetes    Carotid US    [facility] 01/2019: no ICA stenosis   Dyspnea    History of cardiac catheterization    [facility] 10/2012:  no CAD   History of nuclear stress test    Myoview at [facility] 01/2019: no ischemia   History of small bowel obstruction    History of TIA (transient ischemic attack)    Hyperlipidemia    Hypertension    Past Surgical History:  Procedure Laterality Date   ABDOMINAL HYSTERECTOMY     ABDOMINAL SURGERY     DIAGNOSTIC LAPAROSCOPY     LAPAROSCOPIC OOPHERECTOMY Bilateral    Patient Active Problem List   Diagnosis Date Noted   Syncope 10/14/2020   DOE (dyspnea on exertion) 10/14/2020   Chest pain, rule out acute myocardial infarction 10/14/2020   Pulmonary nodules 10/14/2020    REFERRING DIAG:  M25.551 (ICD-10-CM) - Pain in right hip   THERAPY DIAG:  Other low back pain  Pain in right hip  Muscle weakness (generalized)  Rationale for Evaluation and Treatment Rehabilitation  PERTINENT HISTORY: TIA, hypertension   PRECAUTIONS: none   SUBJECTIVE:                                                                                                                                                                                       SUBJECTIVE STATEMENT:  Patient reports she is feeling about the same and her exercises are going well.    PAIN:  Are you having pain? Yes: NPRS scale: 3/10 Pain location: Rt hip Pain description: dull Aggravating factors: driving, sitting Relieving factors: movement    OBJECTIVE: (objective measures completed at initial evaluation unless otherwise dated)  DIAGNOSTIC FINDINGS:  Rt Hip X-ray: Well-maintained  alignment femoral head is well-seated in the acetabulum may be some early  sclerotic changes but no significant osteophyte formation or advanced  degenerative changes.    Lumbar X-ray:  Well-maintained  alignment without any listhesis overall well-preserved joint spaces no  evidence of any fracture or other osseous abnormalities   PATIENT SURVEYS:  FOTO Hip 40% to 53%; Lumbar 47% to 56%   COGNITION:           Overall cognitive status: Within functional limits for tasks assessed                          SENSATION: WFL   MUSCLE LENGTH: Hamstrings: WNL Piriformis: moderate tightness bilaterally      POSTURE: anterior pelvic tilt   PALPATION: TTP Rt greater trochanter and gluteals (tautness);  LUMBAR ROM:    Active  A/PROM  eval  Flexion WNL pain posterior Rt hip  Extension WNL pain in low back   Right lateral flexion 25% limited LBP  Left lateral flexion 25% limited LBP  Right rotation WNL  Left rotation WNL   (Blank rows = not tested)   LOWER EXTREMITY ROM:   Active ROM Right eval Left eval  Hip flexion WNL pain in low back WNL  Hip extension      Hip abduction      Hip adduction      Hip internal rotation      Hip external rotation      Knee flexion      Knee extension      Ankle dorsiflexion      Ankle plantarflexion      Ankle inversion      Ankle eversion       (Blank rows = not tested)   LOWER EXTREMITY MMT:   MMT Right eval Left eval  Hip flexion 5 5  Hip extension 3- 3-  Hip abduction 3+ 3-  Hip adduction       Hip internal rotation 5 5  Hip external rotation 5 5  Knee flexion      Knee extension      Ankle dorsiflexion      Ankle plantarflexion      Ankle inversion      Ankle eversion       (Blank rows = not tested)   SPECIAL TESTS:  (+) SLR (+) FABER (+) FADIR   FUNCTIONAL TESTS:  N/A   GAIT: Distance walked: 10 ft Assistive device utilized: None Level of assistance: Complete Independence Comments: foot ER, limited hip extension/flexion during gait cycle        TODAY'S TREATMENT: [facility] Adult PT Treatment:                                                DATE: 10/08/22 Therapeutic Exercise: NuStep level 5 x 5 minutes UE/LE LTR x 1 minute  Figure 4 stretch 2 x 30 sec  Hip bridge 2 x 10  Supine resisted hip abduction 2 x 10 black band  Supine pelvic tilts 2 x 10  Supine TA march 2 x 10  Clamshells 2 x 15  Updated HEP Manual Therapy: STM Rt gluteals    [facility] Adult PT Treatment:  DATE: 09/18/22 Therapeutic Exercise: Demonstrated and issue initial HEP.    Therapeutic Activity: Education on assessment findings that will be addressed throughout duration of POC.            PATIENT EDUCATION:  Education details: see treatment  Person educated: Patient Education method: Explanation, Demonstration, Tactile cues, Verbal cues, and Handouts Education comprehension: verbalized understanding, returned demonstration, verbal cues required, tactile cues required, and needs further education     HOME EXERCISE PROGRAM: Access Code: [email or web address] URL: [email or web address] Date: 09/18/2022 Prepared by: Letitia Libra   Exercises - Supine Lower Trunk Rotation  - 2 x daily - 7 x weekly - 2 sets - 10 reps - Modified Thomas Stretch  - 2 x daily - 7 x weekly - 3 sets - 30 sec hold - Supine Sciatic Nerve Glide  - 2 x daily - 7 x weekly - 1 sets - 10 reps - Supine Hip External Rotation Stretch  - 2 x daily - 7 x weekly - 3 sets -  30 sec  hold - Seated Hip Abduction with Resistance  - 2 x daily - 7 x weekly - 2 sets - 10 reps   ASSESSMENT:   CLINICAL IMPRESSION: Patient tolerated session well today focusing on progression of hip and core strengthening. She is challenged with dynamic core stabilization having difficulty maintaining TA activation. She reported Rt lateral hip pain with clamshells, but otherwise no complaints of increased pain throughout session. She is noted to have tautness and palpable tenderness about gluteal musculature and will potentially benefit from TPDN at future sessions if tautness remains. HEP was updated to include further strengthening.      OBJECTIVE IMPAIRMENTS decreased activity tolerance, decreased knowledge of condition, difficulty walking, decreased ROM, decreased strength, improper body mechanics, postural dysfunction, and pain.    ACTIVITY LIMITATIONS carrying, lifting, bending, sitting, standing, squatting, and locomotion level   PARTICIPATION LIMITATIONS: cleaning, driving, shopping, and community activity   PERSONAL FACTORS Age, Fitness, Profession, Time since onset of injury/illness/exacerbation, and 1-2 comorbidities: hypertension, TIA  are also affecting patient's functional outcome.    REHAB POTENTIAL: Fair chronicity of injury and other personal factors   CLINICAL DECISION MAKING: Stable/uncomplicated   EVALUATION COMPLEXITY: Low     GOALS: Goals reviewed with patient? Yes   SHORT TERM GOALS: Target date: 10/09/22 Patient will be independent and compliant with initial HEP.  Baseline: issued at eval  Goal status: INITIAL   2.  Patient will report pain as </=5/10 to reduce current functional limitations.  Baseline: 10/10 at worst  Goal status: INITIAL   3.  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  Baseline: increased pain  Goal status: INITIAL     LONG TERM  GOALS: Target date: 11/06/22   Patient will report minimal difficulty with household cleaning as it relates to her back/hip.  Baseline: significant difficulty (requires seated rest breaks) Goal status: INITIAL   2.  Patient will demonstrate at least 4-/5 bilateral hip abductor and extensor strength to improve stability about the chain with prolonged standing and walking activity.  Baseline: see above  Goal status: INITIAL   3.  Patient will score at least 53% hip and 56% lumbar on FOTO to signify clinically meaningful improvement in functional abilities.    Baseline: see above  Goal status: INITIAL   4.  Patient will be independent with advanced home program to progress/maintain current level of function.  Baseline:  n/a Goal status: INITIAL       PLAN: PT FREQUENCY: 1-2x/week   PT DURATION: 6 weeks   PLANNED INTERVENTIONS: Therapeutic exercises, Therapeutic activity, Neuromuscular re-education, Balance training, Gait training, Patient/Family education, Self Care, Aquatic Therapy, Dry Needling, Spinal mobilization, Cryotherapy, Moist heat, Manual therapy, and Re-evaluation   PLAN FOR NEXT SESSION: review HEP; core/hip strengthening, lifting eduction, consider TPDN to glutes.   Letitia Libra, PT, DPT, ATC 10/09/22 8:37 AM

## 2022-10-10 ENCOUNTER — Ambulatory Visit: Payer: Medicare HMO | Admitting: Physical Therapy

## 2022-10-13 NOTE — Therapy (Signed)
OUTPATIENT PHYSICAL THERAPY TREATMENT NOTE   Patient Name: Molly Flores MRN: [id number] DOB:12-02-1955, 66 y.o., female Today's Date: 10/14/2022  PCP: Adrienne Mocha, PA  REFERRING PROVIDER:  Persons, [place], [place]   END OF SESSION:   PT End of Session - 10/14/22 1700     Visit Number 3    Number of Visits 13    Date for PT Re-Evaluation 11/08/22   extending POC for 7 weeks as patient will be out of town for a week during POC   Authorization Type [employer]    Authorization Time Period 10/19-12/1/23    Authorization - Visit Number 2    Authorization - Number of Visits 11    PT Start Time 1700    PT Stop Time 1740    PT Time Calculation (min) 40 min    Activity Tolerance Patient tolerated treatment well    Behavior During Therapy [facility] for tasks assessed/performed              Past Medical History:  Diagnosis Date   Anemia    Borderline diabetes    Carotid US    [facility] 01/2019: no ICA stenosis   Dyspnea    History of cardiac catheterization    [facility] 10/2012:  no CAD   History of nuclear stress test    Myoview at [facility] 01/2019: no ischemia   History of small bowel obstruction    History of TIA (transient ischemic attack)    Hyperlipidemia    Hypertension    Past Surgical History:  Procedure Laterality Date   ABDOMINAL HYSTERECTOMY     ABDOMINAL SURGERY     DIAGNOSTIC LAPAROSCOPY     LAPAROSCOPIC OOPHERECTOMY Bilateral    Patient Active Problem List   Diagnosis Date Noted   Syncope 10/14/2020   DOE (dyspnea on exertion) 10/14/2020   Chest pain, rule out acute myocardial infarction 10/14/2020   Pulmonary nodules 10/14/2020    REFERRING DIAG:  M25.551 (ICD-10-CM) - Pain in right hip   THERAPY DIAG:  Other low back pain  Pain in right hip  Muscle weakness (generalized)  Rationale for Evaluation and Treatment Rehabilitation  PERTINENT HISTORY: TIA, hypertension   PRECAUTIONS: none   SUBJECTIVE:                                                                                                                                                                                       SUBJECTIVE STATEMENT:  Patient reports she is starting to feel better. She did not have any pain yesterday or today. She reports compliance with HEP.    PAIN:  Are you having pain?No    OBJECTIVE: (  objective measures completed at initial evaluation unless otherwise dated)  DIAGNOSTIC FINDINGS:  Rt Hip X-ray: Well-maintained  alignment femoral head is well-seated in the acetabulum may be some early  sclerotic changes but no significant osteophyte formation or advanced  degenerative changes.    Lumbar X-ray:  Well-maintained  alignment without any listhesis overall well-preserved joint spaces no  evidence of any fracture or other osseous abnormalities   PATIENT SURVEYS:  FOTO Hip 40% to 53%; Lumbar 47% to 56%   COGNITION:           Overall cognitive status: Within functional limits for tasks assessed                          SENSATION: WFL   MUSCLE LENGTH: Hamstrings: WNL Piriformis: moderate tightness bilaterally      POSTURE: anterior pelvic tilt   PALPATION: TTP Rt greater trochanter and gluteals (tautness);  LUMBAR ROM:    Active  A/PROM  eval  Flexion WNL pain posterior Rt hip  Extension WNL pain in low back   Right lateral flexion 25% limited LBP  Left lateral flexion 25% limited LBP  Right rotation WNL  Left rotation WNL   (Blank rows = not tested)   LOWER EXTREMITY ROM:   Active ROM Right eval Left eval 10/14/22 Right  Hip flexion WNL pain in low back WNL WNL   Hip extension       Hip abduction       Hip adduction       Hip internal rotation       Hip external rotation       Knee flexion       Knee extension       Ankle dorsiflexion       Ankle plantarflexion       Ankle inversion       Ankle eversion        (Blank rows = not tested)   LOWER EXTREMITY MMT:   MMT Right eval Left eval  Hip flexion 5 5  Hip  extension 3- 3-  Hip abduction 3+ 3-  Hip adduction      Hip internal rotation 5 5  Hip external rotation 5 5  Knee flexion      Knee extension      Ankle dorsiflexion      Ankle plantarflexion      Ankle inversion      Ankle eversion       (Blank rows = not tested)   SPECIAL TESTS:  (+) SLR (+) FABER (+) FADIR   FUNCTIONAL TESTS:  N/A   GAIT: Distance walked: 10 ft Assistive device utilized: None Level of assistance: Complete Independence Comments: foot ER, limited hip extension/flexion during gait cycle        TODAY'S TREATMENT: [facility] Adult PT Treatment:                                                DATE: 10/14/22 Therapeutic Exercise: NuStep level 5 x 5 minutes UE/LE LTR x 60 seconds  Clamshells 2 x 10  SLR 2 x 10  Sit to stand 2 x 10  Standing hip abduction 2 x 10  Standing hip extension 2 x 10  Standing march 2 x 10  Supine TA march 2 x 10  Updated HEP  [facility] Adult PT Treatment:                                                DATE: 10/08/22 Therapeutic Exercise: NuStep level 5 x 5 minutes UE/LE LTR x 1 minute  Figure 4 stretch 2 x 30 sec  Hip bridge 2 x 10  Supine resisted hip abduction 2 x 10 black band  Supine pelvic tilts 2 x 10  Supine TA march 2 x 10  Clamshells 2 x 15  Updated HEP Manual Therapy: STM Rt gluteals    [facility] Adult PT Treatment:                                                DATE: 09/18/22 Therapeutic Exercise: Demonstrated and issue initial HEP.    Therapeutic Activity: Education on assessment findings that will be addressed throughout duration of POC.            PATIENT EDUCATION:  Education details: see treatment  Person educated: Patient Education method: Explanation, Demonstration, Tactile cues, Verbal cues, and Handouts Education comprehension: verbalized understanding, returned demonstration, verbal cues required, tactile cues required, and needs further education     HOME EXERCISE PROGRAM: Access Code:  [email or web address] URL: [email or web address] Date: 09/18/2022 Prepared by: Letitia Libra   Exercises - Supine Lower Trunk Rotation  - 2 x daily - 7 x weekly - 2 sets - 10 reps - Modified Thomas Stretch  - 2 x daily - 7 x weekly - 3 sets - 30 sec hold - Supine Sciatic Nerve Glide  - 2 x daily - 7 x weekly - 1 sets - 10 reps - Supine Hip External Rotation Stretch  - 2 x daily - 7 x weekly - 3 sets - 30 sec  hold - Seated Hip Abduction with Resistance  - 2 x daily - 7 x weekly - 2 sets - 10 reps   ASSESSMENT:   CLINICAL IMPRESSION: Patient arrives without reports of back or hip pain. Continued progression of core and hip strengthening with good tolerance. No reports of hip pain with clamshells this visit. Introduced standing hip strengthening with patient requiring minimal cues to maintain neutral lumbopelvic positioning. Towards the end of standing strengthening she reported mild discomfort about Rt hip, otherwise no complaints of pain throughout session.    OBJECTIVE IMPAIRMENTS decreased activity tolerance, decreased knowledge of condition, difficulty walking, decreased ROM, decreased strength, improper body mechanics, postural dysfunction, and pain.    ACTIVITY LIMITATIONS carrying, lifting, bending, sitting, standing, squatting, and locomotion level   PARTICIPATION LIMITATIONS: cleaning, driving, shopping, and community activity   PERSONAL FACTORS Age, Fitness, Profession, Time since onset of injury/illness/exacerbation, and 1-2 comorbidities: hypertension, TIA  are also affecting patient's functional outcome.    REHAB POTENTIAL: Fair chronicity of injury and other personal factors   CLINICAL DECISION MAKING: Stable/uncomplicated   EVALUATION COMPLEXITY: Low     GOALS: Goals reviewed with patient? Yes   SHORT TERM GOALS: Target date: 10/09/22 Patient will be independent and compliant with initial HEP.  Baseline: issued at eval  Goal status: INITIAL   2.  Patient will  report pain as </=5/10 to reduce current functional limitations.  Baseline: 10/10 at worst  Goal status: INITIAL  3.  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  Baseline: increased pain  Goal status: INITIAL     LONG TERM GOALS: Target date: 11/06/22   Patient will report minimal difficulty with household cleaning as it relates to her back/hip.  Baseline: significant difficulty (requires seated rest breaks) Goal status: INITIAL   2.  Patient will demonstrate at least 4-/5 bilateral hip abductor and extensor strength to improve stability about the chain with prolonged standing and walking activity.  Baseline: see above  Goal status: INITIAL   3.  Patient will score at least 53% hip and 56% lumbar on FOTO to signify clinically meaningful improvement in functional abilities.    Baseline: see above  Goal status: INITIAL   4.  Patient will be independent with advanced home program to progress/maintain current level of function.  Baseline: n/a Goal status: INITIAL       PLAN: PT FREQUENCY: 1-2x/week   PT DURATION: 6 weeks   PLANNED INTERVENTIONS: Therapeutic exercises, Therapeutic activity, Neuromuscular re-education, Balance training, Gait training, Patient/Family education, Self Care, Aquatic Therapy, Dry Needling, Spinal mobilization, Cryotherapy, Moist heat, Manual therapy, and Re-evaluation   PLAN FOR NEXT SESSION: review HEP; core/hip strengthening, lifting eduction, consider TPDN to glutes.   Letitia LibraSamantha Truly Stankiewicz, PT, DPT, ATC 10/14/22 5:40 PM

## 2022-10-14 ENCOUNTER — Ambulatory Visit: Payer: Medicare HMO

## 2022-10-14 DIAGNOSIS — M6281 Muscle weakness (generalized): Secondary | ICD-10-CM

## 2022-10-14 DIAGNOSIS — M5459 Other low back pain: Secondary | ICD-10-CM | POA: Diagnosis not present

## 2022-10-14 DIAGNOSIS — M25551 Pain in right hip: Secondary | ICD-10-CM

## 2022-10-15 ENCOUNTER — Ambulatory Visit (HOSPITAL_COMMUNITY)
Admission: RE | Admit: 2022-10-15 | Discharge: 2022-10-15 | Disposition: A | Discharge status: Home / Self Care | Payer: Medicare HMO | Source: Ambulatory Visit | Attending: Emergency Medicine | Admitting: Emergency Medicine

## 2022-10-15 ENCOUNTER — Encounter (HOSPITAL_COMMUNITY): Payer: Self-pay

## 2022-10-15 VITALS — BP 180/99 | HR 82 | Temp 98.2°F | Resp 18

## 2022-10-15 DIAGNOSIS — Z20822 Contact with and (suspected) exposure to covid-19: Secondary | ICD-10-CM | POA: Diagnosis not present

## 2022-10-15 DIAGNOSIS — I10 Essential (primary) hypertension: Secondary | ICD-10-CM | POA: Diagnosis not present

## 2022-10-15 DIAGNOSIS — H9203 Otalgia, bilateral: Secondary | ICD-10-CM | POA: Diagnosis not present

## 2022-10-15 DIAGNOSIS — J069 Acute upper respiratory infection, unspecified: Secondary | ICD-10-CM | POA: Insufficient documentation

## 2022-10-15 LAB — RESP PANEL BY RT-PCR (FLU A&B, COVID) ARPGX2
Influenza A by PCR: NEGATIVE
Influenza B by PCR: NEGATIVE
SARS Coronavirus 2 by RT PCR: NEGATIVE

## 2022-10-15 MED ORDER — IPRATROPIUM BROMIDE 0.06 % NA SOLN: 2.0000 | Freq: Four times a day (QID) | NASAL | 0 refills | Status: AC

## 2022-10-15 MED ORDER — IBUPROFEN 600 MG PO TABS: 600.0000 mg | ORAL_TABLET | Freq: Four times a day (QID) | ORAL | 0 refills | Status: DC | PRN

## 2022-10-15 NOTE — Discharge Instructions (Addendum)
Mucinex, Atrovent, saline nasal irrigation with the NeilMed sinus rinse and distilled water as often as you want, 1000 mg of Tylenol combined with 600 mg of ibuprofen 3 times a day.  We will contact you if your COVID or flu come back positive and we will prescribe the appropriate medication.

## 2022-10-15 NOTE — ED Provider Notes (Signed)
HPI  SUBJECTIVE:  Molly Flores is a 66 y.o. female who presents with 3 days of a gradual onset, constant diffuse headache, frontal sinus pressure, postnasal drip and occasional dry cough.  She also reports bilateral ear pain.  No fevers, body aches, nasal congestion, rhinorrhea, sore throat, loss of sense of smell or taste, wheezing, shortness of breath, nausea, vomiting, diarrhea, abdominal pain.  No neck stiffness, rash, photophobia.  No facial swelling, upper dental pain.  No known COVID or flu exposure.  She had 3 doses of the COVID-vaccine and this years flu vaccine.  She tried Advil twice and is taking Flonase.  Last dose of Advil was within 6 hours of evaluation.  No aggravating or alleviating factors.  She has a past medical history of hypertension, states that her blood pressure at home has consistently measured in the 120s/80s over the past 3 days.  She also has a history of TIA, hypercholesterolemia, cluster headaches, COVID x2, last bout in December 2022.  [facility]P: [facility]    Past Medical History:  Diagnosis Date   Anemia    Borderline diabetes    Carotid US    [facility] 01/2019: no ICA stenosis   Dyspnea    History of cardiac catheterization    [facility] 10/2012:  no CAD   History of nuclear stress test    Myoview at [facility] 01/2019: no ischemia   History of small bowel obstruction    History of TIA (transient ischemic attack)    Hyperlipidemia    Hypertension     Past Surgical History:  Procedure Laterality Date   ABDOMINAL HYSTERECTOMY     ABDOMINAL SURGERY     DIAGNOSTIC LAPAROSCOPY     LAPAROSCOPIC OOPHERECTOMY Bilateral     Family History  Problem Relation Age of Onset   Diabetes Mother    Hyperlipidemia Mother    Hypertension Mother     Social History   Tobacco Use   Smoking status: Never   Smokeless tobacco: Never  Vaping Use   Vaping Use: Never used  Substance Use Topics   Alcohol use: Never   Drug use: Never    No current facility-administered  medications for this encounter.  Current Outpatient Medications:    ibuprofen (ADVIL) 600 MG tablet, Take 1 tablet (600 mg total) by mouth every 6 (six) hours as needed., Disp: 30 tablet, Rfl: 0   ipratropium (ATROVENT) 0.06 % nasal spray, Place 2 sprays into both nostrils 4 (four) times daily., Disp: 15 mL, Rfl: 0   albuterol (VENTOLIN HFA) 108 (90 Base) MCG/ACT inhaler, Inhale 2 puffs into the lungs daily as needed for wheezing or shortness of breath., Disp: , Rfl:    Ascorbic Acid (VITAMIN C) 500 MG CAPS, Take 500 mg by mouth daily., Disp: , Rfl:    aspirin 81 MG chewable tablet, Chew 81 mg by mouth daily., Disp: , Rfl:    cholecalciferol (VITAMIN D3) 25 MCG (1000 UNIT) tablet, Take 1,000 Units by mouth daily., Disp: , Rfl:    Ferrous Sulfate (IRON) 325 (65 Fe) MG TABS, Take 325 mg by mouth daily., Disp: , Rfl:    fluticasone (FLONASE) 50 MCG/ACT nasal spray, Place 2 sprays into both nostrils daily as needed for allergies., Disp: , Rfl:    magnesium 30 MG tablet, Take 30 mg by mouth daily., Disp: , Rfl:    pravastatin (PRAVACHOL) 80 MG tablet, Take 80 mg by mouth at bedtime., Disp: , Rfl:    tiZANidine (ZANAFLEX) 4 MG tablet, Take  1 tablet (4 mg total) by mouth every 6 (six) hours as needed for muscle spasms., Disp: 14 tablet, Rfl: 0   vitamin B-12 (CYANOCOBALAMIN) 1000 MCG tablet, Take 1,000 mcg by mouth daily., Disp: , Rfl:   Allergies  Allergen Reactions   Oxycodone Other (See Comments)    Hallucinations      ROS  As noted in HPI.   Physical Exam  BP (!) 180/99 (BP Location: Right Arm)   Pulse 82   Temp 98.2 F (36.8 C) (Oral)   Resp 18   LMP  (LMP Unknown)   SpO2 98%   Repeat blood pressure 142/85.  Constitutional: Well developed, well nourished, no acute distress Eyes:  EOMI, conjunctiva normal bilaterally.  PERRLA.  No photophobia. HENT: Normocephalic, atraumatic,mucus membranes moist.  TMs normal bilaterally.  No pain with traction of pinna, palpation of tragus or  mastoid bilaterally.  Bilateral EACs normal.  Erythematous, swollen turbinates with clear nasal congestion.  No maxillary, frontal sinus tenderness.  Normal oropharynx, uvula midline.  Positive postnasal drip. Neck: No cervical lymphadenopathy, meningismus Respiratory: Normal inspiratory effort, lungs clear bilaterally Cardiovascular: Normal rate, regular rhythm, no murmurs, rubs, gallops GI: nondistended skin: No rash, skin intact Musculoskeletal: no deformities Neurologic: Alert & oriented x 3, no focal neuro deficits Psychiatric: Speech and behavior appropriate   ED Course   Medications - No data to display  Orders Placed This Encounter  Procedures   Resp Panel by RT-PCR (Flu A&B, Covid) Anterior Nasal Swab    Standing Status:   Standing    Number of Occurrences:   1    Results for orders placed or performed during the hospital encounter of 10/15/22 (from the past 24 hour(s))  Resp Panel by RT-PCR (Flu A&B, Covid) Anterior Nasal Swab     Status: None   Collection Time: 10/15/22  5:43 PM   Specimen: Anterior Nasal Swab  Result Value Ref Range   SARS Coronavirus 2 by RT PCR NEGATIVE NEGATIVE   Influenza A by PCR NEGATIVE NEGATIVE   Influenza B by PCR NEGATIVE NEGATIVE   No results found.  ED Clinical Impression  1. Upper respiratory tract infection, unspecified type   2. Otalgia of both ears   3. Elevated blood pressure reading with diagnosis of hypertension   4. Encounter for laboratory testing for COVID-19 virus      ED Assessment/Plan     Presentation consistent with an upper respiratory infection.  She may have an early sinusitis.  There is no evidence of otitis media.  Checking COVID, flu.  Will prescribe antivirals if either return positive.  In the meantime, Mucinex, Atrovent, saline nasal irrigation, Tylenol combined with ibuprofen 3 times a day.  Blood pressure came down while in the department.  I do not think that her headache is due to hypertensive  emergency.  She has no other complaints.  Follow-up with PCP as needed.  ER return precautions given  COVID, flu negative.  Plan as above.  Discussed  MDM, treatment plan, and plan for follow-up with patient. Discussed sn/sx that should prompt return to the ED. patient agrees with plan.   Meds ordered this encounter  Medications   ibuprofen (ADVIL) 600 MG tablet    Sig: Take 1 tablet (600 mg total) by mouth every 6 (six) hours as needed.    Dispense:  30 tablet    Refill:  0   ipratropium (ATROVENT) 0.06 % nasal spray    Sig: Place 2 sprays into both nostrils 4 (  four) times daily.    Dispense:  15 mL    Refill:  0      *This clinic note was created using Scientist, clinical (histocompatibility and immunogenetics). Therefore, there may be occasional mistakes despite careful proofreading.  ?    Domenick Gong, MD 10/16/22 [phone]

## 2022-10-15 NOTE — ED Triage Notes (Signed)
Pt reports headache, bilat ear pain and feels may have post nasal due to sensation to clear throat for 3 days. Took advil today twice without relief.

## 2022-10-17 ENCOUNTER — Ambulatory Visit: Payer: Medicare HMO | Admitting: Physical Therapy

## 2022-10-28 ENCOUNTER — Ambulatory Visit: Payer: Medicare HMO

## 2022-10-28 DIAGNOSIS — M6281 Muscle weakness (generalized): Secondary | ICD-10-CM

## 2022-10-28 DIAGNOSIS — M25551 Pain in right hip: Secondary | ICD-10-CM

## 2022-10-28 DIAGNOSIS — M5459 Other low back pain: Secondary | ICD-10-CM

## 2022-10-28 NOTE — Therapy (Addendum)
OUTPATIENT PHYSICAL THERAPY TREATMENT NOTE  PHYSICAL THERAPY DISCHARGE SUMMARY  Visits from Start of Care: 4  Current functional level related to goals / functional outcomes: See goals below   Remaining deficits: Status unknown   Education / Equipment: N/A   Patient agrees to discharge. Patient goals were partially met. Patient is being discharged due to not returning since the last visit.  Patient Name: Molly Flores MRN: [id number] DOB:[birth year], 66 y.o., female Today's Date: 10/29/2022  PCP: Ephriam Jenkins, PA  REFERRING PROVIDER:  Persons, Bevely Palmer, [place]   END OF SESSION:   PT End of Session - 10/28/22 1707     Visit Number 4    Number of Visits 13    Date for PT Re-Evaluation 11/08/22   extending POC for 7 weeks as patient will be out of town for a week during [place] Time Period 10/19-12/1/23    Authorization - Visit Number 3    Authorization - Number of Visits 11    PT Start Time 4584   patient late   PT Stop Time 8350    PT Time Calculation (min) 35 min    Activity Tolerance Patient tolerated treatment well    Behavior During Therapy [facility] for tasks assessed/performed               Past Medical History:  Diagnosis Date   Anemia    Borderline diabetes    Carotid US    [facility] 01/2019: no ICA stenosis   Dyspnea    History of cardiac catheterization    [facility] 10/2012:  no CAD   History of nuclear stress test    Myoview at [facility] 01/2019: no ischemia   History of small bowel obstruction    History of TIA (transient ischemic attack)    Hyperlipidemia    Hypertension    Past Surgical History:  Procedure Laterality Date   ABDOMINAL HYSTERECTOMY     ABDOMINAL SURGERY     DIAGNOSTIC LAPAROSCOPY     LAPAROSCOPIC OOPHERECTOMY Bilateral    Patient Active Problem List   Diagnosis Date Noted   Syncope 10/14/2020   DOE (dyspnea on exertion) 10/14/2020   Chest pain, rule out acute myocardial infarction 10/14/2020    Pulmonary nodules 10/14/2020    REFERRING DIAG:  M25.551 (ICD-10-CM) - Pain in right hip   THERAPY DIAG:  Other low back pain  Pain in right hip  Muscle weakness (generalized)  Rationale for Evaluation and Treatment Rehabilitation  PERTINENT HISTORY: TIA, hypertension   PRECAUTIONS: none   SUBJECTIVE:  SUBJECTIVE STATEMENT:  Patient reports she is doing ok. Feeling most of her pain in her back attributed to being on her feet at Thanksgiving and sleeping in a different bed.    PAIN:  Are you having pain? Yes: NPRS scale: 5/10 Pain location: low back Pain description: dull Aggravating factors: standing activity Relieving factors: sitting;rest     OBJECTIVE: (objective measures completed at initial evaluation unless otherwise dated)  DIAGNOSTIC FINDINGS:  Rt Hip X-ray: Well-maintained  alignment femoral head is well-seated in the acetabulum may be some early  sclerotic changes but no significant osteophyte formation or advanced  degenerative changes.    Lumbar X-ray:  Well-maintained  alignment without any listhesis overall well-preserved joint spaces no  evidence of any fracture or other osseous abnormalities   PATIENT SURVEYS:  FOTO Hip 40% to 53%; Lumbar 47% to 56%   COGNITION:           Overall cognitive status: Within functional limits for tasks assessed                          SENSATION: WFL   MUSCLE LENGTH: Hamstrings: WNL Piriformis: moderate tightness bilaterally      POSTURE: anterior pelvic tilt   PALPATION: TTP Rt greater trochanter and gluteals (tautness);  LUMBAR ROM:    Active  A/PROM  eval 10/28/22  Flexion WNL pain posterior Rt hip WNL  Extension WNL pain in low back  WNL LBP  Right lateral flexion 25% limited LBP   Left lateral flexion 25% limited LBP    Right rotation WNL   Left rotation WNL    (Blank rows = not tested)   LOWER EXTREMITY ROM:   Active ROM Right eval Left eval 10/14/22 Right  Hip flexion WNL pain in low back WNL WNL   Hip extension       Hip abduction       Hip adduction       Hip internal rotation       Hip external rotation       Knee flexion       Knee extension       Ankle dorsiflexion       Ankle plantarflexion       Ankle inversion       Ankle eversion        (Blank rows = not tested)   LOWER EXTREMITY MMT:   MMT Right eval Left eval  Hip flexion 5 5  Hip extension 3- 3-  Hip abduction 3+ 3-  Hip adduction      Hip internal rotation 5 5  Hip external rotation 5 5  Knee flexion      Knee extension      Ankle dorsiflexion      Ankle plantarflexion      Ankle inversion      Ankle eversion       (Blank rows = not tested)   SPECIAL TESTS:  (+) SLR (+) FABER (+) FADIR   FUNCTIONAL TESTS:  N/A   GAIT: Distance walked: 10 ft Assistive device utilized: None Level of assistance: Complete Independence Comments: foot ER, limited hip extension/flexion during gait cycle        TODAY'S TREATMENT: [facility] Adult PT Treatment:  DATE: 10/28/22 Therapeutic Exercise: LTR x 1 minute  Single knee to chest x 10 each; 5 sec hold Supine TA march 2 x 10  Hip bridge with adduction isometric ball squeeze 2 x 10 Seated TA march 2 x 10  3 way stability ball rollout x 2 minutes  Sidelying hip abduction 2 x 10  Updated HEP   Manual therapy: Demonstrated and returned demo of use of tennis ball for self-soft tissue mobilization of lumbar paraspinals and gluteal musculature  Self-care: Posture education     [facility] Adult PT Treatment:                                                DATE: 10/14/22 Therapeutic Exercise: NuStep level 5 x 5 minutes UE/LE LTR x 60 seconds  Clamshells 2 x 10  SLR 2 x 10  Sit to stand 2 x 10  Standing hip abduction 2 x 10   Standing hip extension 2 x 10  Standing march 2 x 10  Supine TA march 2 x 10  Updated HEP   [facility] Adult PT Treatment:                                                DATE: 10/08/22 Therapeutic Exercise: NuStep level 5 x 5 minutes UE/LE LTR x 1 minute  Figure 4 stretch 2 x 30 sec  Hip bridge 2 x 10  Supine resisted hip abduction 2 x 10 black band  Supine pelvic tilts 2 x 10  Supine TA march 2 x 10  Clamshells 2 x 15  Updated HEP Manual Therapy: STM Rt gluteals          PATIENT EDUCATION:  Education details: see treatment  Person educated: Patient Education method: Explanation, Demonstration, Tactile cues, Verbal cues, handout  Education comprehension: verbalized understanding, returned demonstration, verbal cues required, tactile cues required     HOME EXERCISE PROGRAM: Access Code: [email or web address] URL: [email or web address] Date: 09/18/2022 Prepared by: Gwendolyn Grant   Exercises - Supine Lower Trunk Rotation  - 2 x daily - 7 x weekly - 2 sets - 10 reps - Modified Thomas Stretch  - 2 x daily - 7 x weekly - 3 sets - 30 sec hold - Supine Sciatic Nerve Glide  - 2 x daily - 7 x weekly - 1 sets - 10 reps - Supine Hip External Rotation Stretch  - 2 x daily - 7 x weekly - 3 sets - 30 sec  hold - Seated Hip Abduction with Resistance  - 2 x daily - 7 x weekly - 2 sets - 10 reps   ASSESSMENT:   CLINICAL IMPRESSION: Session somewhat limited as patient was late for scheduled visit. Focused on progression of core stabilization and hip strengthening with good tolerance. Patient inquired about ways to reduce muscle tension independently, so was issued tennis ball to complete self-soft tissue mobilization of lumbar and hip musculature. She reported mild pain with sidelying hip abduction, otherwise no complaints of pain throughout session. HEP was updated to include further strengthening.     OBJECTIVE IMPAIRMENTS decreased activity tolerance, decreased knowledge of condition,  difficulty walking, decreased ROM, decreased strength, improper body mechanics, postural dysfunction, and pain.    ACTIVITY LIMITATIONS carrying,  lifting, bending, sitting, standing, squatting, and locomotion level   PARTICIPATION LIMITATIONS: cleaning, driving, shopping, and community activity   PERSONAL FACTORS Age, Fitness, Profession, Time since onset of injury/illness/exacerbation, and 1-2 comorbidities: hypertension, TIA  are also affecting patient's functional outcome.    REHAB POTENTIAL: Fair chronicity of injury and other personal factors   CLINICAL DECISION MAKING: Stable/uncomplicated   EVALUATION COMPLEXITY: Low     GOALS: Goals reviewed with patient? Yes   SHORT TERM GOALS: Target date: 10/09/22 Patient will be independent and compliant with initial HEP.  Baseline: issued at eval  Goal status: met   2.  Patient will report pain as </=5/10 to reduce current functional limitations.  Baseline: 10/10 at worst  Goal status: INITIAL   3.  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  Baseline: increased pain  Goal status: INITIAL     LONG TERM GOALS: Target date: 11/06/22   Patient will report minimal difficulty with household cleaning as it relates to her back/hip.  Baseline: significant difficulty (requires seated rest breaks) Goal status: INITIAL   2.  Patient will demonstrate at least 4-/5 bilateral hip abductor and extensor strength to improve stability about the chain with prolonged standing and walking activity.  Baseline: see above  Goal status: INITIAL   3.  Patient will score at least 53% hip and 56% lumbar on FOTO to signify clinically meaningful improvement in functional abilities.    Baseline: see above  Goal status: INITIAL   4.  Patient will be independent with advanced home program to progress/maintain current level of function.  Baseline: n/a Goal  status: INITIAL       PLAN: PT FREQUENCY: 1-2x/week   PT DURATION: 6 weeks   PLANNED INTERVENTIONS: Therapeutic exercises, Therapeutic activity, Neuromuscular re-education, Balance training, Gait training, Patient/Family education, Self Care, Aquatic Therapy, Dry Needling, Spinal mobilization, Cryotherapy, Moist heat, Manual therapy, and Re-evaluation   PLAN FOR NEXT SESSION: review HEP; core/hip strengthening, lifting eduction, consider TPDN to glutes.   Gwendolyn Grant, PT, DPT, ATC 10/29/22 8:37 AM  Gwendolyn Grant, PT, DPT, ATC 01/05/23 2:40 PM

## 2022-10-31 ENCOUNTER — Ambulatory Visit: Payer: Medicare HMO | Admitting: Physical Therapy

## 2022-11-06 ENCOUNTER — Ambulatory Visit: Payer: Medicare HMO

## 2022-12-02 ENCOUNTER — Other Ambulatory Visit: Payer: Self-pay | Admitting: Family Medicine

## 2022-12-02 ENCOUNTER — Telehealth: Payer: Self-pay | Admitting: Cardiology

## 2022-12-02 DIAGNOSIS — I10 Essential (primary) hypertension: Secondary | ICD-10-CM

## 2022-12-02 DIAGNOSIS — Z13820 Encounter for screening for osteoporosis: Secondary | ICD-10-CM

## 2022-12-02 DIAGNOSIS — R002 Palpitations: Secondary | ICD-10-CM

## 2022-12-02 NOTE — Telephone Encounter (Signed)
STAT if HR is under 50 or over 120 (normal HR is 60-100 beats per minute)  What is your heart rate? 102, didn't check it today        101 before bed  Do you have a log of your heart rate readings (document readings)? She started a log yesterday, because it just started during this yesterday.    Do you have any other symptoms? States when she was at work just standing she could feel it beating fast.

## 2022-12-02 NOTE — Telephone Encounter (Signed)
Pt called back per receiving Dr Theodosia Blender orders.  Pt asked to track her BP and HR morning and evening, writing this information down for one week.  Pt advised to call [facility] to share this information with Dr. Radford Pax.    Pt advised she will wear a Zio Heart Monitor for 2 weeks to assess her HR.  Pt told it will arrive in the mail, and if she has any questions applying it, to call [place] office.  Pt understood Dr. Landis Gandy POC.    Order entered for 2 Week Zio Monitor.

## 2022-12-02 NOTE — Telephone Encounter (Signed)
Pt called [facility] regarding elevated HR and BP values the past two days.    Pt states she had a headache on 12/01/2022, and noticed her HR was 102 on her home pulse Ox.   Pt stated this morning, 12/02/2022, her BP was 146/87 and HR was 101.  Pt was asymptomatic at the time and went to work.    Pt states while at work today had some palpitations.  I called / caught her when she first got home, and pulse Ox was 101 at home.   I had Pt do BP and HR for me on the telephone, and was 175/88, and HR was 93.   Pt was asymptomatic with no palpitations.    I advised Pt to continue checking her BP and HR, and log the numbers she obtains.  If either worsen, to call [facility], and we can schedule a provider appointment to assess her concerns.  I will also make Dr. Radford Pax aware of Pt concerns per Pt request, and will follow up with Pt regarding Dr. Theodosia Blender recommendations / POC.   Follow up required.

## 2022-12-03 ENCOUNTER — Ambulatory Visit: Payer: Medicare HMO | Attending: Cardiology

## 2022-12-03 DIAGNOSIS — I10 Essential (primary) hypertension: Secondary | ICD-10-CM

## 2022-12-03 DIAGNOSIS — R002 Palpitations: Secondary | ICD-10-CM

## 2022-12-03 NOTE — Progress Notes (Unsigned)
Enrolled patient for a 14 day Zio XT  monitor to be mailed to patients home  °

## 2022-12-05 ENCOUNTER — Emergency Department (HOSPITAL_COMMUNITY): Payer: Medicare HMO

## 2022-12-05 ENCOUNTER — Other Ambulatory Visit: Payer: Self-pay

## 2022-12-05 ENCOUNTER — Other Ambulatory Visit: Payer: Medicare HMO

## 2022-12-05 ENCOUNTER — Emergency Department (HOSPITAL_COMMUNITY)
Admission: EM | Admit: 2022-12-05 | Discharge: 2022-12-05 | Disposition: A | Discharge status: Home / Self Care | Payer: Medicare HMO | Attending: Emergency Medicine | Admitting: Emergency Medicine

## 2022-12-05 DIAGNOSIS — R519 Headache, unspecified: Secondary | ICD-10-CM | POA: Diagnosis present

## 2022-12-05 DIAGNOSIS — Z7982 Long term (current) use of aspirin: Secondary | ICD-10-CM | POA: Insufficient documentation

## 2022-12-05 DIAGNOSIS — Z8673 Personal history of transient ischemic attack (TIA), and cerebral infarction without residual deficits: Secondary | ICD-10-CM | POA: Diagnosis not present

## 2022-12-05 DIAGNOSIS — R002 Palpitations: Secondary | ICD-10-CM

## 2022-12-05 DIAGNOSIS — R531 Weakness: Secondary | ICD-10-CM | POA: Diagnosis not present

## 2022-12-05 DIAGNOSIS — I10 Essential (primary) hypertension: Secondary | ICD-10-CM | POA: Insufficient documentation

## 2022-12-05 DIAGNOSIS — R0602 Shortness of breath: Secondary | ICD-10-CM | POA: Insufficient documentation

## 2022-12-05 DIAGNOSIS — Z1152 Encounter for screening for COVID-19: Secondary | ICD-10-CM | POA: Insufficient documentation

## 2022-12-05 DIAGNOSIS — Z13820 Encounter for screening for osteoporosis: Secondary | ICD-10-CM

## 2022-12-05 LAB — CBC WITH DIFFERENTIAL/PLATELET
Abs Immature Granulocytes: 0.01 10*3/uL (ref 0.00–0.07)
Basophils Absolute: 0 10*3/uL (ref 0.0–0.1)
Basophils Relative: 1 %
Eosinophils Absolute: 0.1 10*3/uL (ref 0.0–0.5)
Eosinophils Relative: 1 %
HCT: 36.3 % (ref 36.0–46.0)
Hemoglobin: 11.4 g/dL — ABNORMAL LOW (ref 12.0–15.0)
Immature Granulocytes: 0 %
Lymphocytes Relative: 34 %
Lymphs Abs: 1.8 10*3/uL (ref 0.7–4.0)
MCH: 25.6 pg — ABNORMAL LOW (ref 26.0–34.0)
MCHC: 31.4 g/dL (ref 30.0–36.0)
MCV: 81.4 fL (ref 80.0–100.0)
Monocytes Absolute: 0.4 10*3/uL (ref 0.1–1.0)
Monocytes Relative: 7 %
Neutro Abs: 2.9 10*3/uL (ref 1.7–7.7)
Neutrophils Relative %: 57 %
Platelets: 283 10*3/uL (ref 150–400)
RBC: 4.46 MIL/uL (ref 3.87–5.11)
RDW: 15.8 % — ABNORMAL HIGH (ref 11.5–15.5)
WBC: 5.2 10*3/uL (ref 4.0–10.5)
nRBC: 0 % (ref 0.0–0.2)

## 2022-12-05 LAB — TROPONIN I (HIGH SENSITIVITY)
Troponin I (High Sensitivity): 4 ng/L (ref <18)
Troponin I (High Sensitivity): 4 ng/L (ref <18)

## 2022-12-05 LAB — BASIC METABOLIC PANEL
Anion gap: 10 (ref 5–15)
BUN: 13 mg/dL (ref 8–23)
CO2: 28 mmol/L (ref 22–32)
Calcium: 9.4 mg/dL (ref 8.9–10.3)
Chloride: 99 mmol/L (ref 98–111)
Creatinine, Ser: 0.71 mg/dL (ref 0.44–1.00)
GFR, Estimated: >60 mL/min (ref >60)
Glucose, Bld: 121 mg/dL — ABNORMAL HIGH (ref 70–99)
Potassium: 3.3 mmol/L — ABNORMAL LOW (ref 3.5–5.1)
Sodium: 137 mmol/L (ref 135–145)

## 2022-12-05 LAB — RESP PANEL BY RT-PCR (RSV, FLU A&B, COVID)  RVPGX2
Influenza A by PCR: NEGATIVE
Influenza B by PCR: NEGATIVE
Resp Syncytial Virus by PCR: NEGATIVE
SARS Coronavirus 2 by RT PCR: NEGATIVE

## 2022-12-05 LAB — URINALYSIS, ROUTINE W REFLEX MICROSCOPIC
Bilirubin Urine: NEGATIVE
Glucose, UA: NEGATIVE mg/dL
Hgb urine dipstick: NEGATIVE
Ketones, ur: NEGATIVE mg/dL
Leukocytes,Ua: NEGATIVE
Nitrite: NEGATIVE
Protein, ur: NEGATIVE mg/dL
Specific Gravity, Urine: 1.018 (ref 1.005–1.030)
pH: 6 (ref 5.0–8.0)

## 2022-12-05 MED ORDER — ALUM & MAG HYDROXIDE-SIMETH 200-200-20 MG/5ML PO SUSP
30.0000 mL | Freq: Once | ORAL | Status: AC
  Administered 2022-12-05: 30 mL via ORAL
  Filled 2022-12-05: qty 30

## 2022-12-05 MED ORDER — ACETAMINOPHEN 500 MG PO TABS
1000.0000 mg | ORAL_TABLET | Freq: Once | ORAL | Status: AC
  Administered 2022-12-05: 1000 mg via ORAL
  Filled 2022-12-05: qty 2

## 2022-12-05 NOTE — ED Provider Triage Note (Signed)
Emergency Medicine Provider Triage Evaluation Note  Molly Flores , a 67 y.o. female  was evaluated in triage.  Pt complains of fast heart rate, HTN, fatigue and chest pain since 12/01/22.  Woke up that morning and felt her heart racing.  HR and BP measured at 102 bpm and 160/80s.  Next day noticed HR was 111 bpm after putting on clothes.  Chest pain does not radiate, accompanied with shortness of breath, and worsened with exertion.  Hx of palpitations in past worked up by cardiology several years ago.  Denies fevers, syncope, dizziness, back pain, or leg weakness.  Hx of syncope, DOE, HTN, TIA, SBO, hyperlipidemia, anemia.  Review of Systems  Positive:  Negative: See above  Physical Exam  LMP  (LMP Unknown)  Gen:   Awake, no distress, sitting comfortably Resp:  Normal effort, equal chest rise, CTAB MSK:   Moves extremities without difficulty  Other:  Chest nonTTP.  Communicates well without difficulty.  HR appears regularly regular by palpation/auscultation.  Medical Decision Making  Medically screening exam initiated at 9:16 AM.  Appropriate orders placed.  Molly Flores was informed that the remainder of the evaluation will be completed by another provider, this initial triage assessment does not replace that evaluation, and the importance of remaining in the ED until their evaluation is complete.     Prince Rome, [place] 16[birth year]63 [phone]

## 2022-12-05 NOTE — ED Provider Notes (Signed)
[facility] EMERGENCY DEPARTMENT Provider Note  CSN: [id number] Arrival date & time: 12/05/22 2500  Chief Complaint(s) Palpitations, Fatigue, Headache, Chest Pain, and Shortness of Breath  HPI Molly Flores is a 67 y.o. female with history of hypertension, hyperlipidemia presenting to the emergency department with fatigue.  Patient reports since new years eve she has had fatigue, palpitations, left-sided chest pain.  She reports the chest pain is worse with movement, twisting.  She denies any exertional or pleuritic nature to her pain.  She denies any recent travels or surgeries.  She reports occasional mild shortness of breath.  No cough, sore throat, runny nose.  No nausea or vomiting.  No diaphoresis.  No leg swelling or pain.  She is most concerned about her heart rate, reports that when she is gotten up out of bed and checked her blood pressure and the blood pressure machine says that her heart rate is 110 and she is concerned that this is very high.  She reports that the heart rate goes down when she is resting.  She also reports headache, reports that the headache is gradual onset, intermittent, but does not normally have headaches or history of migraines.   Past Medical History Past Medical History:  Diagnosis Date   Anemia    Borderline diabetes    Carotid US    [facility] 01/2019: no ICA stenosis   Dyspnea    History of cardiac catheterization    [facility] 10/2012:  no CAD   History of nuclear stress test    [facility]oview at [facility] 01/2019: no ischemia   History of small bowel obstruction    History of TIA (transient ischemic attack)    Hyperlipidemia    Hypertension    Patient Active Problem List   Diagnosis Date Noted   Syncope 10/14/2020   DOE (dyspnea on exertion) 10/14/2020   Chest pain, rule out acute myocardial infarction 10/14/2020   Pulmonary nodules 10/14/2020   Home Medication(s) Prior to Admission medications   Medication Sig Start Date End Date Taking?  Authorizing Provider  albuterol (VENTOLIN HFA) 108 (90 Base) MCG/ACT inhaler Inhale 2 puffs into the lungs daily as needed for wheezing or shortness of breath. 11/25/21   [provider]  Ascorbic Acid (VITAMIN C) 500 MG CAPS Take 500 mg by mouth daily.    [provider]  aspirin 81 MG chewable tablet Chew 81 mg by mouth daily.    [provider]  cholecalciferol (VITAMIN D3) 25 MCG (1000 UNIT) tablet Take 1,000 Units by mouth daily.    [provider]  Ferrous Sulfate (IRON) 325 (65 Fe) MG TABS Take 325 mg by mouth daily.    [provider]  fluticasone (FLONASE) 50 MCG/ACT nasal spray Place 2 sprays into both nostrils daily as needed for allergies.    [provider]  ibuprofen (ADVIL) 600 MG tablet Take 1 tablet (600 mg total) by mouth every 6 (six) hours as needed. 10/15/22   Domenick Gong, MD  ipratropium (ATROVENT) 0.06 % nasal spray Place 2 sprays into both nostrils 4 (four) times daily. 10/15/22   Domenick Gong, MD  magnesium 30 MG tablet Take 30 mg by mouth daily.    [provider]  pravastatin (PRAVACHOL) 80 MG tablet Take 80 mg by mouth at bedtime.    [provider]  tiZANidine (ZANAFLEX) 4 MG tablet Take 1 tablet (4 mg total) by mouth every 6 (six) hours as needed for muscle spasms. 12/28/21   Tiburcio Pea,  Carroll Sage, FNP  vitamin B-12 (CYANOCOBALAMIN) 1000 MCG tablet Take 1,000 mcg by mouth daily.    [provider]                                                                                                                                    Past Surgical History Past Surgical History:  Procedure Laterality Date   ABDOMINAL HYSTERECTOMY     ABDOMINAL SURGERY     DIAGNOSTIC LAPAROSCOPY     LAPAROSCOPIC OOPHERECTOMY Bilateral    Family History Family History  Problem Relation Age of Onset   Diabetes Mother    Hyperlipidemia Mother    Hypertension Mother     Social History Social History    Tobacco Use   Smoking status: Never   Smokeless tobacco: Never  Vaping Use   Vaping Use: Never used  Substance Use Topics   Alcohol use: Never   Drug use: Never   Allergies Oxycodone  Review of Systems Review of Systems  All other systems reviewed and are negative.   Physical Exam Vital Signs  I have reviewed the triage vital signs BP 128/74   Pulse 73   Temp 98.2 F (36.8 C)   Resp 16   Ht 5\' 5"  (1.651 m)   Wt 120.2 kg   LMP  (LMP Unknown)   SpO2 100%   BMI 44.10 kg/m  Physical Exam Vitals and nursing note reviewed.  Constitutional:      General: She is not in acute distress.    Appearance: She is well-developed. She is obese.  HENT:     Head: Normocephalic and atraumatic.     Mouth/Throat:     Mouth: Mucous membranes are moist.  Eyes:     Pupils: Pupils are equal, round, and reactive to light.  Cardiovascular:     Rate and Rhythm: Normal rate and regular rhythm.     Heart sounds: No murmur heard. Pulmonary:     Effort: Pulmonary effort is normal. No respiratory distress.     Breath sounds: Normal breath sounds.  Abdominal:     General: Abdomen is flat.     Palpations: Abdomen is soft.     Tenderness: There is no abdominal tenderness.  Musculoskeletal:        General: No tenderness.     Right lower leg: No edema.     Left lower leg: No edema.  Skin:    General: Skin is warm and dry.  Neurological:     General: No focal deficit present.     Mental Status: She is alert. Mental status is at baseline.     Comments: Cranial nerves II through XII intact, strength 5 out of 5 in the bilateral upper and lower extremities, no sensory deficit to light touch, no dysmetria on finger-nose-finger testing  Psychiatric:        Mood and Affect: Mood normal.  Behavior: Behavior normal.     ED Results and Treatments Labs (all labs ordered are listed, but only abnormal results are displayed) Labs Reviewed  BASIC METABOLIC PANEL - Abnormal; Notable for the  following components:      Result Value   Potassium 3.3 (*)    Glucose, Bld 121 (*)    All other components within normal limits  CBC WITH DIFFERENTIAL/PLATELET - Abnormal; Notable for the following components:   Hemoglobin 11.4 (*)    MCH 25.6 (*)    RDW 15.8 (*)    All other components within normal limits  RESP PANEL BY RT-PCR (RSV, FLU A&B, COVID)  RVPGX2  URINALYSIS, ROUTINE W REFLEX MICROSCOPIC  TROPONIN I (HIGH SENSITIVITY)  TROPONIN I (HIGH SENSITIVITY)                                                                                                                          Radiology CT Head Wo Contrast  Result Date: 12/05/2022 CLINICAL DATA:  Headache. EXAM: CT HEAD WITHOUT CONTRAST TECHNIQUE: Contiguous axial images were obtained from the base of the skull through the vertex without intravenous contrast. RADIATION DOSE REDUCTION: This exam was performed according to the departmental dose-optimization program which includes automated exposure control, adjustment of the mA and/or kV according to patient size and/or use of iterative reconstruction technique. COMPARISON:  Head CT and MRI 08/08/2022 FINDINGS: Brain: There is no evidence of an acute infarct, intracranial hemorrhage, mass, midline shift, or extra-axial fluid collection. The ventricles and sulci are normal. Vascular: Calcified atherosclerosis at the skull base. No hyperdense vessel. Skull: No acute fracture or suspicious osseous lesion. Sinuses/Orbits: Visualized paranasal sinuses and mastoid air cells are clear. Unremarkable orbits. Other: None. IMPRESSION: No evidence of acute intracranial abnormality. Electronically Signed   By: Sebastian Ache M.D.   On: 12/05/2022 12:35   DG Chest 2 View  Result Date: 12/05/2022 CLINICAL DATA:  Chest pain. EXAM: CHEST - 2 VIEW COMPARISON:  October 13, 2020. FINDINGS: Stable cardiomediastinal silhouette. Both lungs are clear. The visualized skeletal structures are unremarkable. IMPRESSION: No  active cardiopulmonary disease. Electronically Signed   By: Lupita Raider M.D.   On: 12/05/2022 09:56    Pertinent labs & imaging results that were available during my care of the patient were reviewed by me and considered in my medical decision making (see MDM for details).  Medications Ordered in ED Medications  acetaminophen (TYLENOL) tablet 1,000 mg (1,000 mg Oral Given 12/05/22 1235)  alum & mag hydroxide-simeth (MAALOX/MYLANTA) 200-200-20 MG/5ML suspension 30 mL (30 mLs Oral Given 12/05/22 1342)  Procedures Procedures  (including critical care time)  Medical Decision Making / ED Course   MDM:  67 year old female presenting with fatigue and headaches.  Patient overall well-appearing, physical exam is unremarkable including neurologic exam.  She is not tachycardic.  Her EKG is no significant change from prior.  Unclear cause of fatigue, could be viral syndrome given headaches, will check flu/COVID swab.  Laboratory testing without obvious toxic/metabolic abnormality.  Patient denies any focal infectious symptoms such as cough and her chest x-ray is clear, will check urinalysis although patient denies any urinary symptoms, given age may have atypical presentation with fatigue as presenting symptom.  Abdomen soft with no tenderness to suggest acute intra-abdominal process.  The patient has chest pain, very low concern for ACS, and review of chart patient has had some chronic chest pain, has had a previous coronary CT scan a few years ago with a score of 0 and has seen cardiology for chest pain recently.  Doubt dissection with mild chronic pain and normal cardiomediastinal silhouette.  Doubt pulmonary embolism without leg pain, recent travel or surgeries, tachycardia, hypoxia.  Patient also complains of headache, her neurologic exam is reassuring.  Very low  concern for acute intracranial hemorrhage, tumor, mass effect, occult process such as carbon monoxide poisoning or venous sinus thrombosis.  She denies history of headache but on review of chart patient had a headache in September and received a migraine cocktail.  Given age will obtain repeat head CT but low concern for acute process.  Will treat symptoms with Tylenol, Maalox and reassess.  Clinical Course as of 12/05/22 1524  Fri Dec 05, 2022  1458 Patient feels somewhat better.  Workup so far unremarkable, urinalysis negative.  CT head negative for acute intracranial process.  Patient reports that she has close follow-up with her primary care doctor next Friday.  Will discharge since there is no clear dangerous cause of her weakness. Will discharge patient to home. All questions answered. Patient comfortable with plan of discharge. Return precautions discussed with patient and specified on the after visit summary.  [WS]    Clinical Course User Index [WS] Cristie Hem, MD     Additional history obtained: -External records from outside source obtained and reviewed including: Chart review including previous notes, labs, imaging, consultation notes including previous PMD notes   Lab Tests: -I ordered, reviewed, and interpreted labs.   The pertinent results include:   Labs Reviewed  BASIC METABOLIC PANEL - Abnormal; Notable for the following components:      Result Value   Potassium 3.3 (*)    Glucose, Bld 121 (*)    All other components within normal limits  CBC WITH DIFFERENTIAL/PLATELET - Abnormal; Notable for the following components:   Hemoglobin 11.4 (*)    MCH 25.6 (*)    RDW 15.8 (*)    All other components within normal limits  RESP PANEL BY RT-PCR (RSV, FLU A&B, COVID)  RVPGX2  URINALYSIS, ROUTINE W REFLEX MICROSCOPIC  TROPONIN I (HIGH SENSITIVITY)  TROPONIN I (HIGH SENSITIVITY)    Notable for mild hypokalemia, mild anemia, normal troponin  EKG   EKG  Interpretation  Date/Time:  Friday December 05 2022 09:18:45 EST Ventricular Rate:  86 PR Interval:  140 QRS Duration: 82 QT Interval:  390 QTC Calculation: 466 R Axis:   2 Text Interpretation: Normal sinus rhythm Minimal voltage criteria for LVH, may be normal variant ( R in aVL ) When compared with ECG of 08-Aug-2022 13:45, No significant change  since last tracing Confirmed by Alvino Blood ([id number]) on 12/05/2022 12:34:42 PM         Imaging Studies ordered: I ordered imaging studies including CXR, CT head On my interpretation imaging demonstrates no acute process I independently visualized and interpreted imaging. I agree with the radiologist interpretation   Medicines ordered and prescription drug management: Meds ordered this encounter  Medications   acetaminophen (TYLENOL) tablet 1,000 mg   alum & mag hydroxide-simeth (MAALOX/MYLANTA) 200-200-20 MG/5ML suspension 30 mL    -I have reviewed the patients home medicines and have made adjustments as needed   Social Determinants of Health:  Diagnosis or treatment significantly limited by social determinants of health: obesity   Reevaluation: After the interventions noted above, I reevaluated the patient and found that they have improved  Co morbidities that complicate the patient evaluation  Past Medical History:  Diagnosis Date   Anemia    Borderline diabetes    Carotid US    [facility] 01/2019: no ICA stenosis   Dyspnea    History of cardiac catheterization    [facility] 10/2012:  no CAD   History of nuclear stress test    Myoview at [facility] 01/2019: no ischemia   History of small bowel obstruction    History of TIA (transient ischemic attack)    Hyperlipidemia    Hypertension       Dispostion: Disposition decision including need for hospitalization was considered, and patient discharged from emergency department.    Final Clinical Impression(s) / ED Diagnoses Final diagnoses:  Nonintractable headache,  unspecified chronicity pattern, unspecified headache type  Weakness     This chart was dictated using voice recognition software.  Despite best efforts to proofread,  errors can occur which can change the documentation meaning.    Lonell Grandchild, MD 12/05/22 1524

## 2022-12-05 NOTE — ED Triage Notes (Signed)
Pt. Stated, I've been feeling fatigue and my heart rate has been fast all the way up to 111. This started New Years Day

## 2022-12-05 NOTE — Discharge Instructions (Addendum)
We evaluated you for your weakness.  Your laboratory tests were reassuring.  We did not find any dangerous cause for your weakness such as an infection.  We obtained a CT scan of your head which did not show any dangerous process.  Please follow-up closely with your primary doctor.

## 2022-12-09 ENCOUNTER — Other Ambulatory Visit: Payer: Self-pay | Admitting: Family Medicine

## 2022-12-09 DIAGNOSIS — E2839 Other primary ovarian failure: Secondary | ICD-10-CM

## 2022-12-31 ENCOUNTER — Ambulatory Visit (INDEPENDENT_AMBULATORY_CARE_PROVIDER_SITE_OTHER): Payer: Medicare HMO

## 2022-12-31 ENCOUNTER — Ambulatory Visit (HOSPITAL_COMMUNITY)
Admission: EM | Admit: 2022-12-31 | Discharge: 2022-12-31 | Disposition: A | Discharge status: Home / Self Care | Payer: Medicare HMO | Attending: Family Medicine | Admitting: Family Medicine

## 2022-12-31 ENCOUNTER — Telehealth: Payer: Self-pay | Admitting: Cardiology

## 2022-12-31 ENCOUNTER — Encounter (HOSPITAL_COMMUNITY): Payer: Self-pay

## 2022-12-31 DIAGNOSIS — S92514A Nondisplaced fracture of proximal phalanx of right lesser toe(s), initial encounter for closed fracture: Secondary | ICD-10-CM

## 2022-12-31 MED ORDER — KETOROLAC TROMETHAMINE 30 MG/ML IJ SOLN
INTRAMUSCULAR | Status: AC
  Filled 2022-12-31: qty 1

## 2022-12-31 MED ORDER — KETOROLAC TROMETHAMINE 30 MG/ML IJ SOLN
30.0000 mg | Freq: Once | INTRAMUSCULAR | Status: AC
  Administered 2022-12-31: 30 mg via INTRAMUSCULAR

## 2022-12-31 MED ORDER — IBUPROFEN 600 MG PO TABS: 600.0000 mg | ORAL_TABLET | Freq: Three times a day (TID) | ORAL | 0 refills | Status: AC | PRN

## 2022-12-31 NOTE — ED Provider Notes (Signed)
[facility]    CSN: [id number] Arrival date & time: 12/31/22  1615      History   Chief Complaint Chief Complaint  Patient presents with   Foot Injury    HPI LETISIA SCHWALB is a 67 y.o. female.    Foot Injury  Here for toe pain. This morning she was walking into her bathroom and hit her fourth and fifth toes on the chest.  The fourth one now is swollen and painful and is a little deviated laterally.  He also has pain up in her lateral forefoot.  Past Medical History:  Diagnosis Date   Anemia    Borderline diabetes    Carotid US    [facility] 01/2019: no ICA stenosis   Dyspnea    History of cardiac catheterization    [facility] 10/2012:  no CAD   History of nuclear stress test    Myoview at [facility] 01/2019: no ischemia   History of small bowel obstruction    History of TIA (transient ischemic attack)    Hyperlipidemia    Hypertension     Patient Active Problem List   Diagnosis Date Noted   Syncope 10/14/2020   DOE (dyspnea on exertion) 10/14/2020   Chest pain, rule out acute myocardial infarction 10/14/2020   Pulmonary nodules 10/14/2020    Past Surgical History:  Procedure Laterality Date   ABDOMINAL HYSTERECTOMY     ABDOMINAL SURGERY     DIAGNOSTIC LAPAROSCOPY     LAPAROSCOPIC OOPHERECTOMY Bilateral     OB History   No obstetric history on file.      Home Medications    Prior to Admission medications   Medication Sig Start Date End Date Taking? Authorizing Provider  Ascorbic Acid (VITAMIN C) 500 MG CAPS Take 500 mg by mouth daily.   Yes [provider]  aspirin 81 MG chewable tablet Chew 81 mg by mouth daily.   Yes [provider]  cholecalciferol (VITAMIN D3) 25 MCG (1000 UNIT) tablet Take 1,000 Units by mouth daily.   Yes [provider]  Ferrous Sulfate (IRON) 325 (65 Fe) MG TABS Take 325 mg by mouth daily.   Yes [provider]  hydrochlorothiazide (HYDRODIURIL) 25 MG tablet Take 25 mg by mouth every  morning. 10/03/22  Yes [provider]  ibuprofen (ADVIL) 600 MG tablet Take 1 tablet (600 mg total) by mouth every 8 (eight) hours as needed (pain). 12/31/22  Yes Barrett Henle, MD  magnesium 30 MG tablet Take 30 mg by mouth daily.   Yes [provider]  pravastatin (PRAVACHOL) 80 MG tablet Take 80 mg by mouth at bedtime.   Yes [provider]  albuterol (VENTOLIN HFA) 108 (90 Base) MCG/ACT inhaler Inhale 2 puffs into the lungs daily as needed for wheezing or shortness of breath. 11/25/21   [provider]  fluticasone (FLONASE) 50 MCG/ACT nasal spray Place 2 sprays into both nostrils daily as needed for allergies.    [provider]  ipratropium (ATROVENT) 0.06 % nasal spray Place 2 sprays into both nostrils 4 (four) times daily. 10/15/22   Melynda Ripple, MD  tiZANidine (ZANAFLEX) 4 MG tablet Take 1 tablet (4 mg total) by mouth every 6 (six) hours as needed for muscle spasms. 12/28/21   Scot Jun, NP  vitamin B-12 (CYANOCOBALAMIN) 1000 MCG tablet Take 1,000 mcg by mouth daily.    [provider]    Family History Family History  Problem Relation Age of Onset  Diabetes Mother    Hyperlipidemia Mother    Hypertension Mother     Social History Social History   Tobacco Use   Smoking status: Never   Smokeless tobacco: Never  Vaping Use   Vaping Use: Never used  Substance Use Topics   Alcohol use: Never   Drug use: Never     Allergies   Oxycodone   Review of Systems Review of Systems   Physical Exam Triage Vital Signs ED Triage Vitals  Enc Vitals Group     BP 12/31/22 1730 (!) 145/80     Pulse Rate 12/31/22 1730 84     Resp 12/31/22 1730 16     Temp 12/31/22 1730 98.3 F (36.8 C)     Temp Source 12/31/22 1730 Oral     SpO2 12/31/22 1730 98 %     Weight --      Height --      Head Circumference --      Peak Flow --      Pain Score 12/31/22 1727 10     Pain Loc --      Pain Edu? --      Excl. in  [place]? --    No data found.  Updated Vital Signs BP (!) 145/80 (BP Location: Left Arm)   Pulse 84   Temp 98.3 F (36.8 C) (Oral)   Resp 16   LMP  (LMP Unknown)   SpO2 98%   Visual Acuity Right Eye Distance:   Left Eye Distance:   Bilateral Distance:    Right Eye Near:   Left Eye Near:    Bilateral Near:     Physical Exam Vitals reviewed.  Constitutional:      General: She is not in acute distress.    Appearance: She is not ill-appearing, toxic-appearing or diaphoretic.  Musculoskeletal:     Comments: Her fourth and fifth toes on the right foot are tender and a little swollen.  The fourth toe is a little deviated laterally.  There is tenderness and swelling also of the lateral forefoot.  Skin:    Coloration: Skin is not jaundiced or pale.  Neurological:     Mental Status: She is alert and oriented to person, place, and time.  Psychiatric:        Behavior: Behavior normal.      UC Treatments / Results  Labs (all labs ordered are listed, but only abnormal results are displayed) Labs Reviewed - No data to display  EKG   Radiology DG Foot Complete Right  Result Date: 12/31/2022 CLINICAL DATA:  Right fourth and fifth toe pain. Hip thumb on a chest. Foot and ankle swelling. EXAM: RIGHT FOOT COMPLETE - 3+ VIEW COMPARISON:  None Available. FINDINGS: There is a deformity involving the proximal shaft of the fourth proximal phalanx which is suspicious for a nondisplaced fracture. No additional fracture or dislocation identified. Dorsal soft tissue swelling is identified overlying the metatarsal bones and proximal phalanges. Small plantar and posterior calcaneal heel spurs. IMPRESSION: 1. Suspect nondisplaced fracture involves the proximal shaft of the fourth proximal phalanx. 2. Dorsal soft tissue swelling. Electronically Signed   By: Kerby Moors M.D.   On: 12/31/2022 18:32   LONG TERM MONITOR (3-14 DAYS)  Result Date: 12/31/2022   Predominant rhythm was normal sinus rhythm  with average heart rate of 83 bpm and ranged from 48 to 148 bpm.   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 Predominant underlying rhythm was Sinus Rhythm. 6 Supraventricular Tachycardia runs occurred, the run with the fastest interval lasting 4 beats with a max rate of 144 bpm, the longest lasting 5 beats with an avg rate of 94 bpm. 1 episode(s) of AV Block (3rd) occurred, lasting a total of 13 secs. Second Degree AV Block-Mobitz I (Wenckebach) was present. Isolated SVEs were rare (<1.0%), SVE Couplets were rare (<1.0%), and no SVE Triplets were present. Isolated VEs were rare (<1.0%), and no VE Couplets or VE Triplets were present. MD notification criteria for Complete Heart Block met - report posted prior to notification per account request (PK).    Procedures Procedures (including critical care time)  Medications Ordered in UC Medications  ketorolac (TORADOL) 30 MG/ML injection 30 mg (has no administration in time range)    Initial Impression / Assessment and Plan / UC Course  I have reviewed the triage vital signs and the nursing notes.  Pertinent labs & imaging results that were available during my care of the patient were reviewed by me and considered in my medical decision making (see chart for details).        Her x-ray shows a fracture of the small phalanx of the fourth toe.  We discussed options and we decided on the boot. Final Clinical Impressions(s) / UC Diagnoses   Final diagnoses:  Closed nondisplaced fracture of proximal phalanx of lesser toe of right foot, initial encounter     Discharge Instructions      There  is a fracture of the nearest part of your fourth right toe.  You have been given a shot of Toradol 30 mg today.  Take ibuprofen 600 mg--1 tab every 8 hours as needed for pain.    .     ED Prescriptions     Medication Sig Dispense Auth. Provider   ibuprofen (ADVIL) 600 MG tablet Take 1 tablet (600 mg total) by mouth every 8 (eight) hours as needed (pain). 15 tablet Adylene Dlugosz, Gwenlyn Perking, MD      PDMP not reviewed this encounter.   Barrett Henle, MD 12/31/22 1901

## 2022-12-31 NOTE — Telephone Encounter (Signed)
[facility] called to report that  had CHB and bradycardia on 12/06/22 on her Zio and the report has been reviewed by Dr Radford Pax this morning.

## 2022-12-31 NOTE — Discharge Instructions (Addendum)
There is a fracture of the nearest part of your fourth right toe.  You have been given a shot of Toradol 30 mg today.  Take ibuprofen 600 mg--1 tab every 8 hours as needed for pain.    Marland Kitchen

## 2022-12-31 NOTE — Telephone Encounter (Signed)
Abnormal zio patch results. Please advise  

## 2022-12-31 NOTE — ED Triage Notes (Signed)
Patient states that her right foot and ankle are swelling. She was getting up to go to the bathroom and hit her foot. Happened early this morning. Took Tylenol for the pain with no relief.

## 2023-01-01 ENCOUNTER — Telehealth: Payer: Self-pay | Admitting: Cardiology

## 2023-01-01 DIAGNOSIS — I442 Atrioventricular block, complete: Secondary | ICD-10-CM

## 2023-01-01 DIAGNOSIS — R0609 Other forms of dyspnea: Secondary | ICD-10-CM

## 2023-01-01 NOTE — Telephone Encounter (Signed)
-----  Message from Nuala Alpha, LPN sent at 6/59/9357 10:13 AM EST -----  ----- Message ----- From: Sueanne Margarita, MD Sent: 12/31/2022   8:41 AM EST To: [facility] Triage  Heart monitor showed occasional extra heartbeats from the top of the heart up to 5 in a row called nonsustained atrial tachycardia or SVT.  She does have some intermittent heart block during sleep likely related to sleep apnea.  I had ordered a sleep study on her in 2022 that does not appear to have been done.  I would like her to go to the sleep lab for a PSG

## 2023-01-01 NOTE — Telephone Encounter (Signed)
The patient has been notified of the result and verbalized understanding.  All questions (if any) were answered. Bernestine Amass, RN 01/01/2023 2:11 PM   Sleep study has been ordered.

## 2023-01-01 NOTE — Telephone Encounter (Signed)
Pt is returning call and is requesting return call in regards to monitor results.

## 2023-01-05 DIAGNOSIS — S90121A Contusion of right lesser toe(s) without damage to nail, initial encounter: Secondary | ICD-10-CM | POA: Diagnosis not present

## 2023-01-05 DIAGNOSIS — S92514A Nondisplaced fracture of proximal phalanx of right lesser toe(s), initial encounter for closed fracture: Secondary | ICD-10-CM | POA: Diagnosis not present

## 2023-02-03 DIAGNOSIS — S90121D Contusion of right lesser toe(s) without damage to nail, subsequent encounter: Secondary | ICD-10-CM | POA: Diagnosis not present

## 2023-02-05 DIAGNOSIS — K140 Glossitis: Secondary | ICD-10-CM | POA: Diagnosis not present

## 2023-02-05 DIAGNOSIS — J069 Acute upper respiratory infection, unspecified: Secondary | ICD-10-CM | POA: Diagnosis not present

## 2023-04-06 ENCOUNTER — Ambulatory Visit (HOSPITAL_COMMUNITY)
Admission: EM | Admit: 2023-04-06 | Discharge: 2023-04-06 | Disposition: A | Discharge status: Home / Self Care | Payer: Medicare HMO | Attending: Emergency Medicine | Admitting: Emergency Medicine

## 2023-04-06 ENCOUNTER — Encounter (HOSPITAL_COMMUNITY): Payer: Self-pay | Admitting: Emergency Medicine

## 2023-04-06 DIAGNOSIS — J069 Acute upper respiratory infection, unspecified: Secondary | ICD-10-CM | POA: Diagnosis not present

## 2023-04-06 LAB — POCT RAPID STREP A (OFFICE): Rapid Strep A Screen: NEGATIVE

## 2023-04-06 MED ORDER — LIDOCAINE VISCOUS HCL 2 % MT SOLN: 15.0000 mL | OROMUCOSAL | 0 refills | Status: AC | PRN

## 2023-04-06 MED ORDER — PREDNISONE 20 MG PO TABS: 40.0000 mg | ORAL_TABLET | Freq: Every day | ORAL | 0 refills | Status: AC

## 2023-04-06 NOTE — ED Provider Notes (Signed)
[facility]    CSN: [id number] Arrival date & time: 04/06/23  1613      History   Chief Complaint Chief Complaint  Patient presents with   Sore Throat   Otalgia    HPI Molly Flores is a 67 y.o. female.    Patient presents for evaluation of nasal congestion with green sputum, rhinorrhea, sore throat mild nonproductive cough and neck discomfort present for 1 day.  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  Has attempted use of ibuprofen which has been minimally effective.  Denies respiratory history, non-smoker.  Denies fever chills or bodyaches.   Past Medical History:  Diagnosis Date   Anemia    Borderline diabetes    Carotid US    [facility] 01/2019: no ICA stenosis   Dyspnea    History of cardiac catheterization    [facility] 10/2012:  no CAD   History of nuclear stress test    Myoview at [facility] 01/2019: no ischemia   History of small bowel obstruction    History of TIA (transient ischemic attack)    Hyperlipidemia    Hypertension     Patient Active Problem List   Diagnosis Date Noted   Syncope 10/14/2020   DOE (dyspnea on exertion) 10/14/2020   Chest pain, rule out acute myocardial infarction 10/14/2020   Pulmonary nodules 10/14/2020    Past Surgical History:  Procedure Laterality Date   ABDOMINAL HYSTERECTOMY     ABDOMINAL SURGERY     DIAGNOSTIC LAPAROSCOPY     LAPAROSCOPIC OOPHERECTOMY Bilateral     OB History   No obstetric history on file.      Home Medications    Prior to Admission medications   Medication Sig Start Date End Date Taking? Authorizing Provider  albuterol (VENTOLIN HFA) 108 (90 Base) MCG/ACT inhaler Inhale 2 puffs into the lungs daily as needed for wheezing or shortness of breath. 11/25/21   [provider]  Ascorbic Acid (VITAMIN C) 500 MG CAPS Take 500 mg by mouth daily.    [provider]  aspirin 81 MG chewable tablet Chew 81 mg by mouth daily.    [provider]  cholecalciferol  (VITAMIN D3) 25 MCG (1000 UNIT) tablet Take 1,000 Units by mouth daily.    [provider]  Ferrous Sulfate (IRON) 325 (65 Fe) MG TABS Take 325 mg by mouth daily.    [provider]  fluticasone (FLONASE) 50 MCG/ACT nasal spray Place 2 sprays into both nostrils daily as needed for allergies.    [provider]  hydrochlorothiazide (HYDRODIURIL) 25 MG tablet Take 25 mg by mouth every morning. 10/03/22   [provider]  ibuprofen (ADVIL) 600 MG tablet Take 1 tablet (600 mg total) by mouth every 8 (eight) hours as needed (pain). 12/31/22   Zenia Resides, MD  ipratropium (ATROVENT) 0.06 % nasal spray Place 2 sprays into both nostrils 4 (four) times daily. 10/15/22   Domenick Gong, MD  magnesium 30 MG tablet Take 30 mg by mouth daily.    [provider]  pravastatin (PRAVACHOL) 80 MG tablet Take 80 mg by mouth at bedtime.    [provider]  tiZANidine (ZANAFLEX) 4 MG tablet Take 1 tablet (4 mg total) by mouth every 6 (six) hours as needed for muscle spasms. 12/28/21   Bing Neighbors, NP  vitamin B-12 (CYANOCOBALAMIN) 1000 MCG tablet Take 1,000 mcg by mouth daily.    [provider]    Family History Family  History  Problem Relation Age of Onset   Diabetes Mother    Hyperlipidemia Mother    Hypertension Mother     Social History Social History   Tobacco Use   Smoking status: Never   Smokeless tobacco: Never  Vaping Use   Vaping Use: Never used  Substance Use Topics   Alcohol use: Never   Drug use: Never     Allergies   Lisinopril and Oxycodone   Review of Systems Review of Systems  Constitutional: Negative.   HENT:  Positive for congestion, ear pain, rhinorrhea and sore throat. Negative for dental problem, drooling, ear discharge, facial swelling, hearing loss, mouth sores, nosebleeds, postnasal drip, sinus pressure, sinus pain, sneezing, tinnitus, trouble swallowing and voice change.   Respiratory:   Positive for cough. Negative for apnea, choking, chest tightness, shortness of breath, wheezing and stridor.   Cardiovascular: Negative.      Physical Exam Triage Vital Signs ED Triage Vitals  Enc Vitals Group     BP 04/06/23 1720 (!) 163/96     Pulse Rate 04/06/23 1720 82     Resp 04/06/23 1720 18     Temp 04/06/23 1720 98.3 F (36.8 C)     Temp Source 04/06/23 1720 Oral     SpO2 04/06/23 1720 97 %     Weight --      Height --      Head Circumference --      Peak Flow --      Pain Score 04/06/23 1719 9     Pain Loc --      Pain Edu? --      Excl. in GC? --    No data found.  Updated Vital Signs BP (!) 163/96 (BP Location: Left Arm)   Pulse 82   Temp 98.3 F (36.8 C) (Oral)   Resp 18   LMP  (LMP Unknown)   SpO2 97%   Visual Acuity Right Eye Distance:   Left Eye Distance:   Bilateral Distance:    Right Eye Near:   Left Eye Near:    Bilateral Near:     Physical Exam Constitutional:      Appearance: Normal appearance.  HENT:     Head: Normocephalic.     Right Ear: Hearing, ear canal and external ear normal. A middle ear effusion is present.     Left Ear: Hearing, ear canal and external ear normal. A middle ear effusion is present.     Nose: Congestion present. No rhinorrhea.     Mouth/Throat:     Mouth: Mucous membranes are moist.     Pharynx: Posterior oropharyngeal erythema present.  Cardiovascular:     Rate and Rhythm: Normal rate and regular rhythm.     Pulses: Normal pulses.     Heart sounds: Normal heart sounds.  Pulmonary:     Effort: Pulmonary effort is normal.     Breath sounds: Normal breath sounds.  Skin:    General: Skin is warm and dry.  Neurological:     Mental Status: She is alert and oriented to person, place, and time. Mental status is at baseline.      UC Treatments / Results  Labs (all labs ordered are listed, but only abnormal results are displayed) Labs Reviewed  POCT RAPID STREP A (OFFICE)    EKG   Radiology No  results found.  Procedures Procedures (including critical care time)  Medications Ordered in UC Medications - No data to display  Initial Impression /  Assessment and Plan / UC Course  I have reviewed the triage vital signs and the nursing notes.  Pertinent labs & imaging results that were available during my care of the patient were reviewed by me and considered in my medical decision making (see chart for details).  Viral URI with cough  Patient is in no signs of distress nor toxic appearing.  Vital signs are stable.  Low suspicion for pneumonia, pneumothorax or bronchitis and therefore will defer imaging.  Rapid strep test is negative.  Prescribed lidocaine viscous, prednisone. May use additional over-the-counter medications as needed for supportive care.  May follow-up with urgent care as needed if symptoms persist or worsen.    Final Clinical Impressions(s) / UC Diagnoses   Final diagnoses:  None   Discharge Instructions   None    ED Prescriptions   None    PDMP not reviewed this encounter.   Valinda Hoar, NP 04/06/23 1756

## 2023-04-06 NOTE — ED Triage Notes (Signed)
Pt c/o bilateral ear pain and sore throat since yesterday. Took ibuprofen and gargled with warm salt water

## 2023-04-06 NOTE — Discharge Instructions (Signed)
Your symptoms today are most likely being caused by a virus and should steadily improve in time it can take up to 7 to 10 days before you truly start to see a turnaround however things will get better  Strep test is negative for bacteria in the throat  During tomorrow take prednisone every morning with food for 5 days to help reduce inflammation to the airway which ideally will help with your pain, you may take Tylenol in addition to this medicine every 6 hours as needed  You may gargle and spit lidocaine solution every 4-6 hours to provide temporary relief to your throat    You can take Tylenol and/or Ibuprofen as needed for fever reduction and pain relief.   For cough: honey 1/2 to 1 teaspoon (you can dilute the honey in water or another fluid).  You can also use guaifenesin and dextromethorphan for cough. You can use a humidifier for chest congestion and cough.  If you don't have a humidifier, you can sit in the bathroom with the hot shower running.      For sore throat: try warm salt water gargles, cepacol lozenges, throat spray, warm tea or water with lemon/honey, popsicles or ice, or OTC cold relief medicine for throat discomfort.   For congestion: take a daily anti-histamine like Zyrtec, Claritin, and a oral decongestant, such as pseudoephedrine.  You can also use Flonase 1-2 sprays in each nostril daily.   It is important to stay hydrated: drink plenty of fluids (water, gatorade/powerade/pedialyte, juices, or teas) to keep your throat moisturized and help further relieve irritation/discomfort.

## 2023-04-09 DIAGNOSIS — J039 Acute tonsillitis, unspecified: Secondary | ICD-10-CM | POA: Diagnosis not present

## 2023-04-09 DIAGNOSIS — J029 Acute pharyngitis, unspecified: Secondary | ICD-10-CM | POA: Diagnosis not present

## 2023-05-06 DIAGNOSIS — M25561 Pain in right knee: Secondary | ICD-10-CM | POA: Diagnosis not present

## 2023-05-06 DIAGNOSIS — M5441 Lumbago with sciatica, right side: Secondary | ICD-10-CM | POA: Diagnosis not present

## 2023-05-10 IMAGING — DX DG KNEE 1-2V PORT*R*
4 series · 4 of 4 positions shown · non-contrast
Comparison: None Available.

CLINICAL DATA: Right knee pain.

EXAM:
PORTABLE RIGHT KNEE - 1-2 VIEW

[knee ap]
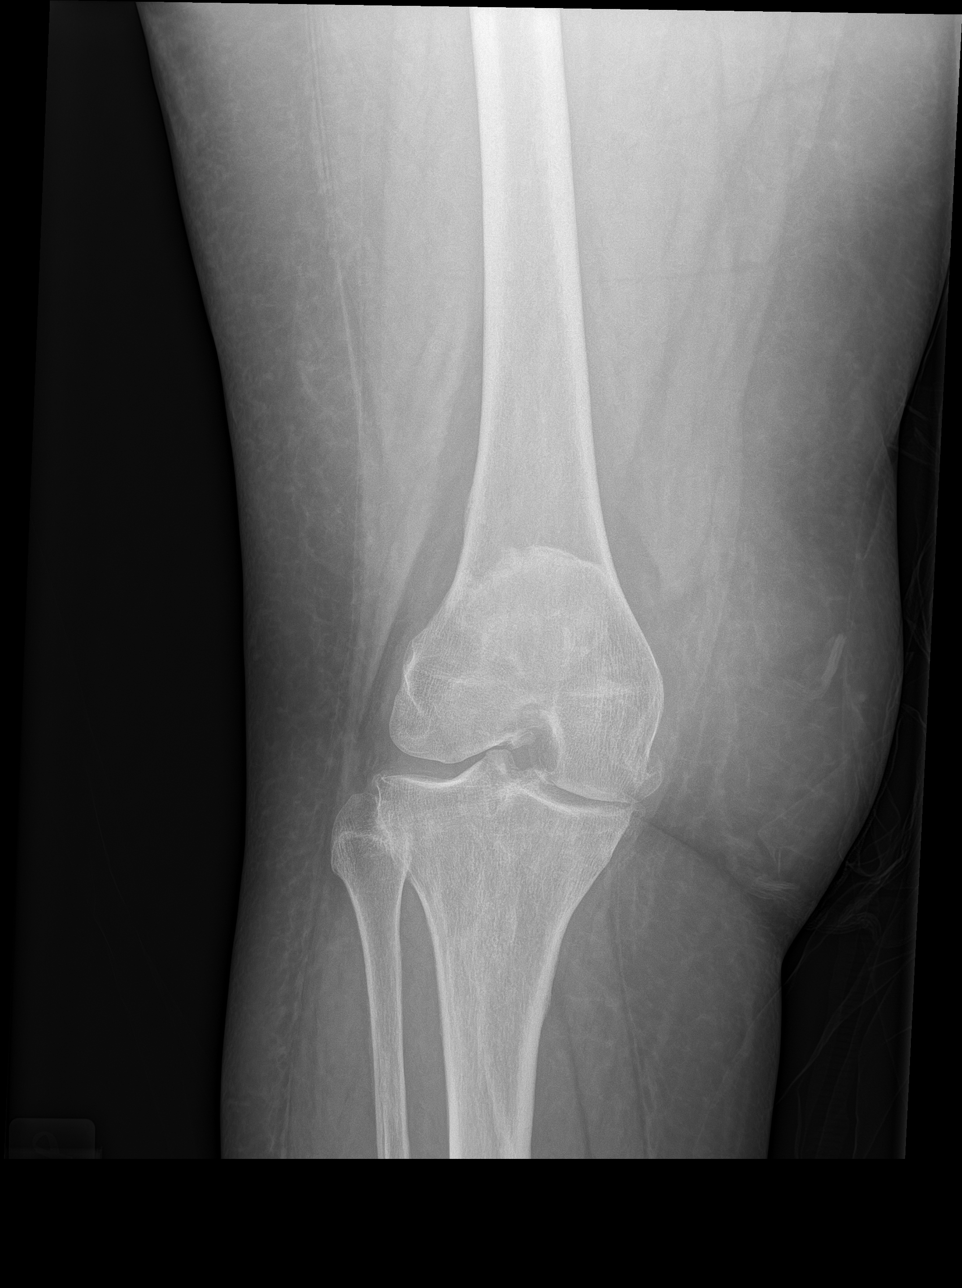

[knee lat]
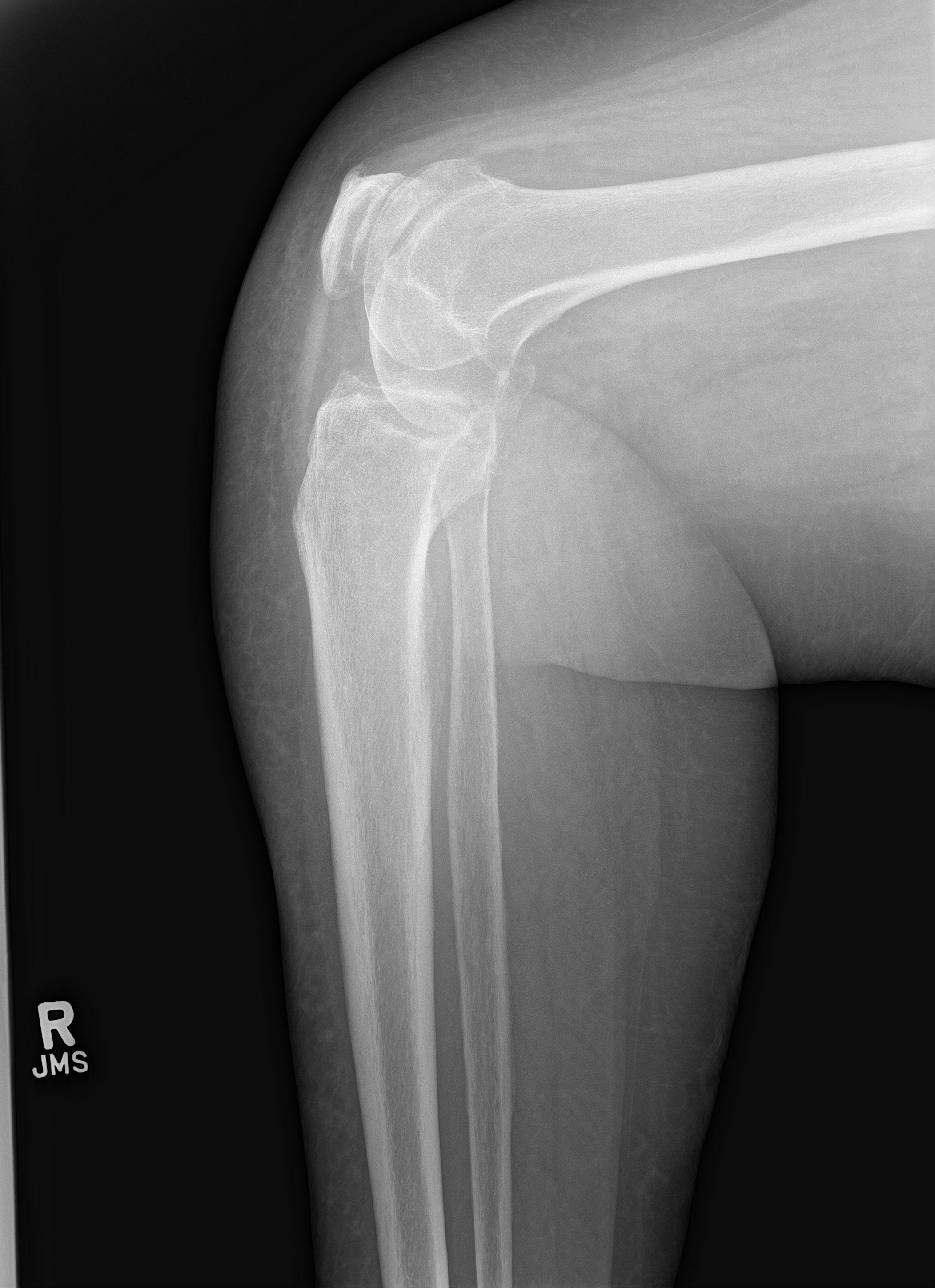

[knee obl (1 of 2)]
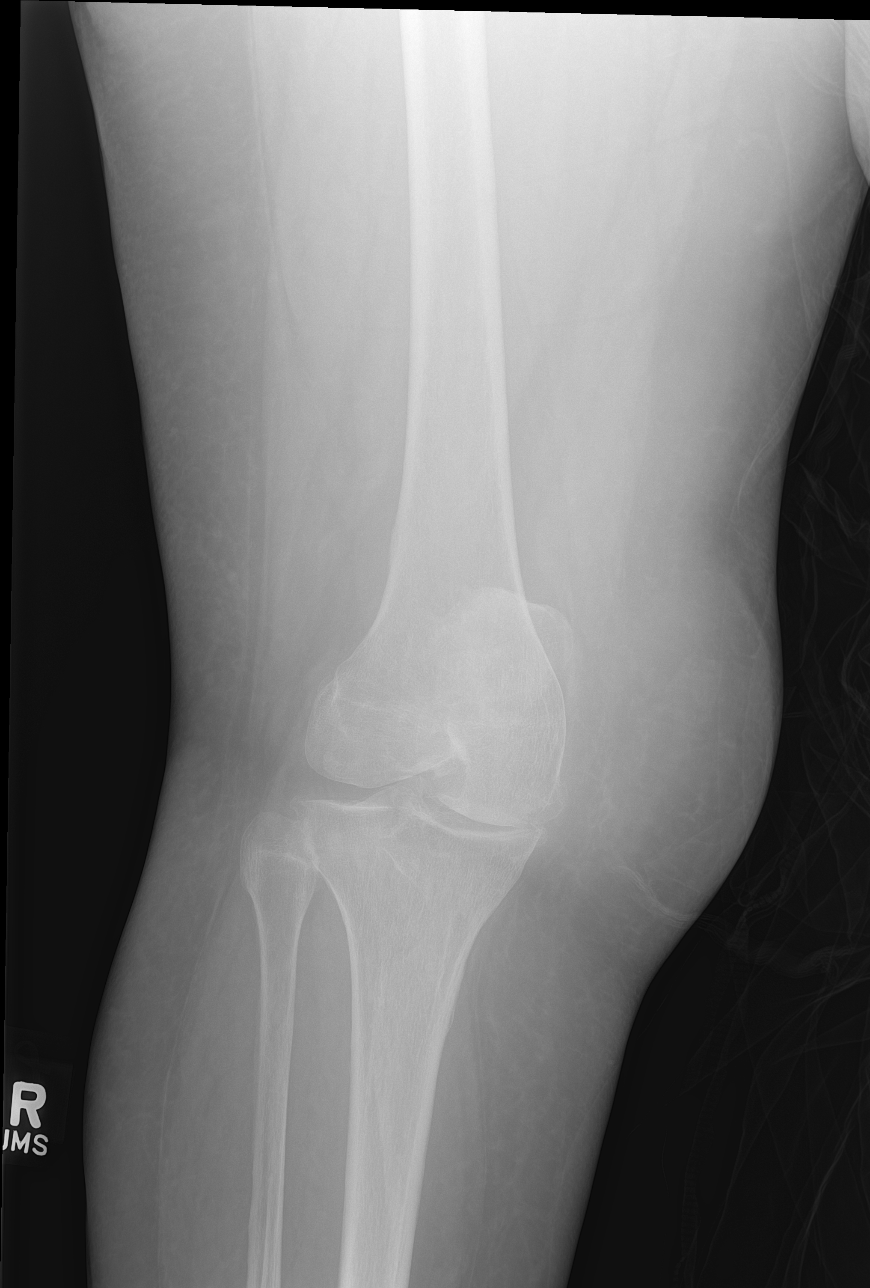

[knee obl (2 of 2)]
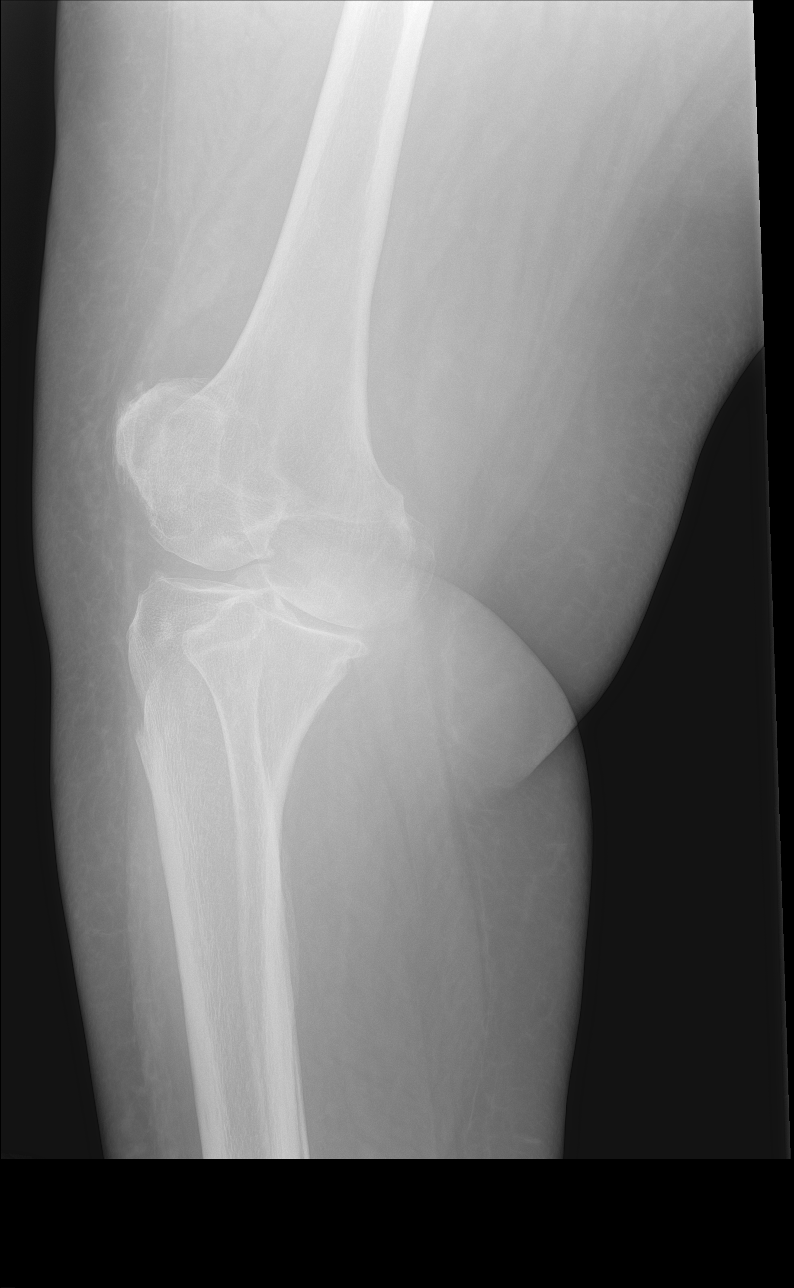

[4 of 4 positions shown; findings below may reference images not displayed]

FINDINGS: Osteopenia. There is no evidence of fracture or joint effusion.
There is joint space loss and osteophytes involving the medial
femorotibial and patellofemoral joints, and patellar enthesopathy
anteriorly.

No loose bodies or radiopaque foreign bodies are seen. Surrounding
soft tissues are unremarkable.
IMPRESSION: Osteopenia and degenerative change without evidence of fractures.

## 2023-05-14 ENCOUNTER — Other Ambulatory Visit: Payer: Self-pay

## 2023-05-14 ENCOUNTER — Emergency Department (HOSPITAL_BASED_OUTPATIENT_CLINIC_OR_DEPARTMENT_OTHER): Payer: Medicare HMO

## 2023-05-14 ENCOUNTER — Emergency Department (HOSPITAL_COMMUNITY)
Admission: EM | Admit: 2023-05-14 | Discharge: 2023-05-14 | Disposition: A | Discharge status: Home / Self Care | Payer: Medicare HMO | Attending: Emergency Medicine | Admitting: Emergency Medicine

## 2023-05-14 ENCOUNTER — Emergency Department (HOSPITAL_COMMUNITY): Payer: Medicare HMO

## 2023-05-14 ENCOUNTER — Encounter (HOSPITAL_COMMUNITY): Payer: Self-pay

## 2023-05-14 DIAGNOSIS — Z7982 Long term (current) use of aspirin: Secondary | ICD-10-CM | POA: Diagnosis not present

## 2023-05-14 DIAGNOSIS — R7989 Other specified abnormal findings of blood chemistry: Secondary | ICD-10-CM | POA: Diagnosis not present

## 2023-05-14 DIAGNOSIS — M79604 Pain in right leg: Secondary | ICD-10-CM | POA: Diagnosis not present

## 2023-05-14 DIAGNOSIS — I10 Essential (primary) hypertension: Secondary | ICD-10-CM | POA: Insufficient documentation

## 2023-05-14 DIAGNOSIS — M79661 Pain in right lower leg: Secondary | ICD-10-CM

## 2023-05-14 DIAGNOSIS — R0602 Shortness of breath: Secondary | ICD-10-CM | POA: Insufficient documentation

## 2023-05-14 DIAGNOSIS — R531 Weakness: Secondary | ICD-10-CM | POA: Diagnosis not present

## 2023-05-14 DIAGNOSIS — Z79899 Other long term (current) drug therapy: Secondary | ICD-10-CM | POA: Diagnosis not present

## 2023-05-14 DIAGNOSIS — R079 Chest pain, unspecified: Secondary | ICD-10-CM | POA: Diagnosis not present

## 2023-05-14 DIAGNOSIS — R0789 Other chest pain: Secondary | ICD-10-CM | POA: Diagnosis not present

## 2023-05-14 LAB — CBC
HCT: 35.7 % — ABNORMAL LOW (ref 36.0–46.0)
Hemoglobin: 10.9 g/dL — ABNORMAL LOW (ref 12.0–15.0)
MCH: 25.4 pg — ABNORMAL LOW (ref 26.0–34.0)
MCHC: 30.5 g/dL (ref 30.0–36.0)
MCV: 83.2 fL (ref 80.0–100.0)
Platelets: 250 10*3/uL (ref 150–400)
RBC: 4.29 MIL/uL (ref 3.87–5.11)
RDW: 16.2 % — ABNORMAL HIGH (ref 11.5–15.5)
WBC: 7.1 10*3/uL (ref 4.0–10.5)
nRBC: 0 % (ref 0.0–0.2)

## 2023-05-14 LAB — TROPONIN I (HIGH SENSITIVITY)
Troponin I (High Sensitivity): 5 ng/L (ref <18)
Troponin I (High Sensitivity): 5 ng/L (ref <18)

## 2023-05-14 LAB — D-DIMER, QUANTITATIVE: D-Dimer, Quant: 2.7 ug/mL-FEU — ABNORMAL HIGH (ref 0.00–0.50)

## 2023-05-14 LAB — BASIC METABOLIC PANEL
Anion gap: 7 (ref 5–15)
BUN: 14 mg/dL (ref 8–23)
CO2: 31 mmol/L (ref 22–32)
Calcium: 8.8 mg/dL — ABNORMAL LOW (ref 8.9–10.3)
Chloride: 100 mmol/L (ref 98–111)
Creatinine, Ser: 0.78 mg/dL (ref 0.44–1.00)
GFR, Estimated: >60 mL/min (ref >60)
Glucose, Bld: 124 mg/dL — ABNORMAL HIGH (ref 70–99)
Potassium: 4 mmol/L (ref 3.5–5.1)
Sodium: 138 mmol/L (ref 135–145)

## 2023-05-14 LAB — BRAIN NATRIURETIC PEPTIDE: B Natriuretic Peptide: 6.2 pg/mL (ref 0.0–100.0)

## 2023-05-14 MED ORDER — TIZANIDINE HCL 2 MG PO TABS: 2.0000 mg | ORAL_TABLET | Freq: Three times a day (TID) | ORAL | 0 refills | Status: AC | PRN

## 2023-05-14 MED ORDER — LIDOCAINE 5 % EX PTCH
2.0000 | MEDICATED_PATCH | Freq: Once | CUTANEOUS | Status: DC
  Administered 2023-05-14: 2 via TRANSDERMAL
  Filled 2023-05-14: qty 2

## 2023-05-14 MED ORDER — TIZANIDINE HCL 4 MG PO TABS
2.0000 mg | ORAL_TABLET | Freq: Once | ORAL | Status: AC
  Administered 2023-05-14: 2 mg via ORAL
  Filled 2023-05-14: qty 1

## 2023-05-14 MED ORDER — IOHEXOL 350 MG/ML SOLN
65.0000 mL | Freq: Once | INTRAVENOUS | Status: AC | PRN
  Administered 2023-05-14: 65 mL via INTRAVENOUS

## 2023-05-14 NOTE — ED Notes (Signed)
Walked patient her heart rate went up as high as 180 now its normal while sitting her oxygen level stayed at 98 went to 96 while walking

## 2023-05-14 NOTE — ED Notes (Signed)
Pt ambulated in room with MD and HR was 103

## 2023-05-14 NOTE — ED Provider Notes (Signed)
[place] EMERGENCY DEPARTMENT AT [facility] Provider Note   CSN: [id number] Arrival date & time: 05/14/23  4098     History  Chief Complaint  Patient presents with   Leg Pain   Chest Pain    Molly Flores is a 67 y.o. female.  Molly Flores is a 67 y.o. female with history of hypertension, hyperlipidemia, anemia and dyspnea, who presents to the emergency department for evaluation of leg pain and shortness of breath.  Patient reports she has been experiencing right leg pain for 2 weeks, saw her PCP regarding this and was prescribed prednisone but she is still having discomfort.  She reports that over the past week she started having some intermittent chest pain and shortness of breath and yesterday experienced some left arm pain which concerned her.  Patient denies any history of blood clots.  She is not on any anticoagulation.  No recent long distance travel or surgeries.  She reports that shortness of breath seems to come and go, sometimes worse with exertion.  Chest pain is described as a sharp discomfort in the center of her chest it is not made worse with exertion, no associated nausea or vomiting, no lightheadedness or syncope, no diaphoresis.  Patient reports an occasional nonproductive cough. Has followed with Dr. Mayford Knife with cardiology, no known history of a heart attack.  Coronary CTA performed in 2021 with calcium score of 0.  Had recent Zio patch monitoring in January that showed a few episodes of nonsustained atrial tachycardia and some ectopy, as well as heart block during sleep suspected to be related to sleep apnea.    The history is provided by the patient and medical records.  Leg Pain Associated symptoms: no fever   Chest Pain Associated symptoms: shortness of breath   Associated symptoms: no abdominal pain, no diaphoresis, no fever, no nausea, no numbness, no vomiting and no weakness        Home Medications Prior to Admission medications   Medication  Sig Start Date End Date Taking? Authorizing Provider  albuterol (VENTOLIN HFA) 108 (90 Base) MCG/ACT inhaler Inhale 2 puffs into the lungs daily as needed for wheezing or shortness of breath. 11/25/21   [provider]  Ascorbic Acid (VITAMIN C) 500 MG CAPS Take 500 mg by mouth daily.    [provider]  aspirin 81 MG chewable tablet Chew 81 mg by mouth daily.    [provider]  cholecalciferol (VITAMIN D3) 25 MCG (1000 UNIT) tablet Take 1,000 Units by mouth daily.    [provider]  Ferrous Sulfate (IRON) 325 (65 Fe) MG TABS Take 325 mg by mouth daily.    [provider]  fluticasone (FLONASE) 50 MCG/ACT nasal spray Place 2 sprays into both nostrils daily as needed for allergies.    [provider]  hydrochlorothiazide (HYDRODIURIL) 25 MG tablet Take 25 mg by mouth every morning. 10/03/22   [provider]  ibuprofen (ADVIL) 600 MG tablet Take 1 tablet (600 mg total) by mouth every 8 (eight) hours as needed (pain). 12/31/22   Zenia Resides, MD  ipratropium (ATROVENT) 0.06 % nasal spray Place 2 sprays into both nostrils 4 (four) times daily. 10/15/22   Domenick Gong, MD  lidocaine (XYLOCAINE) 2 % solution Use as directed 15 mLs in the mouth or throat every 4 (four) hours as needed for mouth pain. 04/06/23   Valinda Hoar, NP  magnesium 30 MG tablet Take 30 mg by mouth daily.  [provider]  pravastatin (PRAVACHOL) 80 MG tablet Take 80 mg by mouth at bedtime.    [provider]  predniSONE (DELTASONE) 20 MG tablet Take 2 tablets (40 mg total) by mouth daily. 04/06/23   White, Elita Boone, NP  tiZANidine (ZANAFLEX) 4 MG tablet Take 1 tablet (4 mg total) by mouth every 6 (six) hours as needed for muscle spasms. 12/28/21   Bing Neighbors, NP  vitamin B-12 (CYANOCOBALAMIN) 1000 MCG tablet Take 1,000 mcg by mouth daily.    [provider]      Allergies    Lisinopril and Oxycodone    Review of  Systems   Review of Systems  Constitutional:  Negative for chills, diaphoresis and fever.  HENT: Negative.    Respiratory:  Positive for shortness of breath.   Cardiovascular:  Positive for chest pain. Negative for leg swelling.  Gastrointestinal: Negative.  Negative for abdominal pain, nausea and vomiting.  Genitourinary:  Negative for dysuria and frequency.  Musculoskeletal:        Right leg pain  Skin:  Negative for color change and wound.  Neurological:  Negative for syncope, weakness, light-headedness and numbness.    Physical Exam Updated Vital Signs BP 131/64   Pulse 79   Temp 99.1 F (37.3 C) (Oral)   Resp 16   Ht 5\' 5"  (1.651 m)   Wt 120 kg   LMP  (LMP Unknown)   SpO2 99%   BMI 44.02 kg/m  Physical Exam Vitals and nursing note reviewed.  Constitutional:      General: She is not in acute distress.    Appearance: Normal appearance. She is well-developed. She is not diaphoretic.  HENT:     Head: Normocephalic and atraumatic.  Eyes:     General:        Right eye: No discharge.        Left eye: No discharge.     Pupils: Pupils are equal, round, and reactive to light.  Cardiovascular:     Rate and Rhythm: Normal rate and regular rhythm.     Pulses: Normal pulses.          Radial pulses are 2+ on the right side and 2+ on the left side.       Dorsalis pedis pulses are 2+ on the right side and 2+ on the left side.       Posterior tibial pulses are 2+ on the right side and 2+ on the left side.     Heart sounds: Normal heart sounds. No murmur heard.    No friction rub. No gallop.  Pulmonary:     Effort: Pulmonary effort is normal. No respiratory distress.     Breath sounds: Normal breath sounds. No wheezing or rales.     Comments: Respirations equal and unlabored, patient able to speak in full sentences, lungs clear to auscultation bilaterally  Chest:     Chest wall: No tenderness.  Abdominal:     General: Bowel sounds are normal. There is no distension.      Palpations: Abdomen is soft. There is no mass.     Tenderness: There is no abdominal tenderness. There is no guarding.     Comments: Abdomen soft, nondistended, nontender to palpation in all quadrants without guarding or peritoneal signs  Musculoskeletal:        General: No deformity.     Cervical back: Neck supple.     Right lower leg: Tenderness present. No edema.  Left lower leg: No edema.     Comments: Bilateral lower extremities without edema, warm and well-perfused without wounds or discoloration, distal pulses 2+, normal range of motion at the knee ankle and hip without tenderness over the joints, mild tenderness to palpation in the right lower leg without palpable deformity, erythema or swelling.  Skin:    General: Skin is warm and dry.     Capillary Refill: Capillary refill takes less than 2 seconds.  Neurological:     Mental Status: She is alert and oriented to person, place, and time.     Coordination: Coordination normal.     Comments: Speech is clear, able to follow commands CN III-XII intact Normal strength in upper and lower extremities bilaterally including dorsiflexion and plantar flexion, strong and equal grip strength Sensation normal to light and sharp touch Moves extremities without ataxia, coordination intact  Psychiatric:        Mood and Affect: Mood normal.        Behavior: Behavior normal.     ED Results / Procedures / Treatments   Labs (all labs ordered are listed, but only abnormal results are displayed) Labs Reviewed  BASIC METABOLIC PANEL - Abnormal; Notable for the following components:      Result Value   Glucose, Bld 124 (*)    Calcium 8.8 (*)    All other components within normal limits  CBC - Abnormal; Notable for the following components:   Hemoglobin 10.9 (*)    HCT 35.7 (*)    MCH 25.4 (*)    RDW 16.2 (*)    All other components within normal limits  D-DIMER, QUANTITATIVE - Abnormal; Notable for the following components:   D-Dimer,  Quant 2.70 (*)    All other components within normal limits  BRAIN NATRIURETIC PEPTIDE  TROPONIN I (HIGH SENSITIVITY)  TROPONIN I (HIGH SENSITIVITY)    EKG EKG Interpretation Date/Time:  Thursday May 14 2023 09:43:10 EDT Ventricular Rate:  75 PR Interval:  150 QRS Duration:  83 QT Interval:  398 QTC Calculation: 445 R Axis:   28  Text Interpretation: Sinus rhythm Low voltage, precordial leads Abnormal R-wave progression, early transition LVH by voltage no sig change from previous Confirmed by Arby Barrette [phone]) on 05/14/2023 11:26:50 AM  Radiology VAS [place] LOWER EXTREMITY VENOUS (DVT) (7a-7p)  Result Date: 05/14/2023  Lower Venous DVT Study Patient Name:  MANUELLA SAHAGIAN  Date of Exam:   05/14/2023 Medical Rec #: [id number]       Accession #:    [id number] Date of Birth: June 20, 1956       Patient Gender: F Patient Age:   37 years Exam Location:  [facility] Procedure:      VAS [place] LOWER EXTREMITY VENOUS (DVT) Referring Phys: Adelina Mings Syris Brookens --------------------------------------------------------------------------------  Indications: Pain.  Limitations: Body habitus. Comparison Study: Prior negative right LEV done 05/16/2022 Performing Technologist: Sherren Kerns RVS  Examination Guidelines: A complete evaluation includes B-mode imaging, spectral Doppler, color Doppler, and power Doppler as needed of all accessible portions of each vessel. Bilateral testing is considered an integral part of a complete examination. Limited examinations for reoccurring indications may be performed as noted. The reflux portion of the exam is performed with the patient in reverse Trendelenburg.  +---------+---------------+---------+-----------+----------+--------------+ RIGHT    CompressibilityPhasicitySpontaneityPropertiesThrombus Aging +---------+---------------+---------+-----------+----------+--------------+ CFV      Full           Yes      Yes                                  +---------+---------------+---------+-----------+----------+--------------+  SFJ      Full                                                        +---------+---------------+---------+-----------+----------+--------------+ FV Prox  Full                                                        +---------+---------------+---------+-----------+----------+--------------+ FV Mid   Full           Yes      Yes                                 +---------+---------------+---------+-----------+----------+--------------+ FV DistalFull                                                        +---------+---------------+---------+-----------+----------+--------------+ PFV      Full                                                        +---------+---------------+---------+-----------+----------+--------------+ POP      Full           Yes      Yes                                 +---------+---------------+---------+-----------+----------+--------------+ PTV      Full                                                        +---------+---------------+---------+-----------+----------+--------------+ PERO     Full                                                        +---------+---------------+---------+-----------+----------+--------------+   +----+---------------+---------+-----------+----------+--------------+ LEFTCompressibilityPhasicitySpontaneityPropertiesThrombus Aging +----+---------------+---------+-----------+----------+--------------+ CFV Full           Yes      Yes                                 +----+---------------+---------+-----------+----------+--------------+     Summary: RIGHT: - Findings appear essentially unchanged compared to previous examination. - There is no evidence of deep vein thrombosis in the lower extremity.  - No cystic structure found in the popliteal fossa.  LEFT: - No evidence of deep vein thrombosis in the lower extremity. No indirect  evidence of obstruction proximal to the inguinal  ligament.  *See table(s) above for measurements and observations. Electronically signed by Sherald Hess MD on 05/14/2023 at 3:52:42 PM.    Final    CT Angio Chest PE W and/or Wo Contrast  Result Date: 05/14/2023 CLINICAL DATA:  Positive D-dimer, right leg pain, clinical suspicion for PE EXAM: CT ANGIOGRAPHY CHEST WITH CONTRAST TECHNIQUE: Multidetector CT imaging of the chest was performed using the standard protocol during bolus administration of intravenous contrast. Multiplanar CT image reconstructions and MIPs were obtained to evaluate the vascular anatomy. RADIATION DOSE REDUCTION: This exam was performed according to the departmental dose-optimization program which includes automated exposure control, adjustment of the mA and/or kV according to patient size and/or use of iterative reconstruction technique. CONTRAST:  65mL OMNIPAQUE IOHEXOL 350 MG/ML SOLN COMPARISON:  Previous studies including the chest radiograph done today pain, CTA chest done on 10/13/2020 FINDINGS: Cardiovascular: There are no intraluminal filling defects in pulmonary artery branches. There is homogeneous enhancement in thoracic aorta. Mediastinum/Nodes: There are a few subcentimeter nodes in the mediastinum and hilar regions. Lungs/Pleura: There is no focal pulmonary consolidation. There is no pleural effusion or pneumothorax. In image 66 of series 6, there is 5 mm plaque-like nodule in left lower lung field. In the sagittal images, this nodule appears to be in the interlobar fissure. Similar finding was seen in the previous examination. In the same image, there is an ill-defined 6 mm opacity in right mid lung field which appears to be in the minor fissure with no significant interval change. No follow-up is recommended. Upper Abdomen: No acute findings are seen. Musculoskeletal: Unremarkable. Review of the MIP images confirms the above findings. IMPRESSION: There is no evidence of  pulmonary artery embolism. There is no evidence of thoracic aortic dissection. There is no focal pulmonary consolidation. There is no pleural effusion or pneumothorax. There are small plaque-like pleural-based nodules in both lungs which have not changed significantly since 10/13/2020. No follow-up imaging is recommended. Electronically Signed   By: Ernie Avena M.D.   On: 05/14/2023 12:32   DG Chest 2 View  Result Date: 05/14/2023 CLINICAL DATA:  67 year old female with chest pain, shortness of breath and weakness for 2 days. EXAM: CHEST - 2 VIEW COMPARISON:  Chest radiographs 12/05/2022 and earlier. FINDINGS: AP and lateral views at 1015 hours. Cardiac size at the upper limits of normal. Mild tortuosity of the descending thoracic aorta. Other mediastinal contours are within normal limits. Visualized tracheal air column is within normal limits. Lung volumes are within normal limits. Both lungs appear clear. No pneumothorax or pleural effusion. Chronically exaggerated upper thoracic kyphosis appears stable. No acute osseous abnormality identified. Negative visible bowel gas. IMPRESSION: No acute cardiopulmonary abnormality. Electronically Signed   By: Odessa Fleming M.D.   On: 05/14/2023 10:31     Procedures Procedures    Medications Ordered in ED Medications  iohexol (OMNIPAQUE) 350 MG/ML injection 65 mL (65 mLs Intravenous Contrast Given 05/14/23 1146)  tiZANidine (ZANAFLEX) tablet 2 mg (2 mg Oral Given 05/14/23 1406)    ED Course/ Medical Decision Making/ A&P                             Medical Decision Making Amount and/or Complexity of Data Reviewed Labs: ordered. Radiology: ordered.  Risk Prescription drug management.   67 y.o. female presents to the ED with complaints of 2 weeks of leg pain followed by some chest pain and shortness of breath over the past few  days, this involves an extensive number of treatment options, and is a complaint that carries with it a high risk of  complications and morbidity.  The differential diagnosis includes PE/DVT, ACS, CHF, arrhythmia, pneumonia, viral respiratory infection, COPD/asthma, cellulitis, muscular injury or spasm  On arrival pt is nontoxic, vitals WNL. Exam significant for no significant swelling in the lower extremities, bilateral lower extremities are warm and well-perfused with distal pulses 2+, no concern for arterial occlusion and no obvious deformity  Additional history obtained from medical record. Previous records obtained and reviewed   I ordered medication Zanaflex for leg pain as well as lidocaine patch.  Lab Tests:  I Ordered, reviewed, and interpreted labs, which included: No leukocytosis and stable hemoglobin, no significant electrolyte derangements and normal renal function, troponin negative x 2 and BNP is not elevated, D-dimer elevated at 2.70 increasing concern for potential VTE  Imaging Studies ordered:  I ordered imaging studies which included chest x-ray, lower extremity venous Doppler and CT angio PE study, I independently visualized and interpreted imaging which showed no acute cardiopulmonary abnormalities noted on chest x-ray no evidence of deep vein thrombosis in the right lower extremity.  CTA without evidence of pulmonary embolism, no evidence of aortic dissection and no evidence of pulmonary edema or consolidation unchanged pulmonary nodules.  ED Course:   Patient's evaluation today has been largely reassuring, patient has not shown any signs of respiratory distress, respirations and SpO2 saturations have remained stable during ED encounter.  Patient was ambulated with ED tech who documented a heart rate increased into the 180s although I suspect that this was motion artifact and not with good waveform when I ambulated the patient myself she was able to ambulate in the room without difficulty heart rate did not exceed 103 and respiratory effort and SpO2 saturations remained normal.  Unclear  etiology for the episodes of chest pain and shortness of breath that the patient had she has already had extensive cardiac workup recently with a calcium score of 0 within the last 3 years which is very reassuring from an ACS standpoint, fortunately no evidence of PE.  Recommend outpatient PCP follow-up for continued evaluation of the symptoms.  At this time there does not appear to be any evidence of an acute emergency medical condition requiring further emergent evaluation and the patient appears stable for discharge with appropriate outpatient follow up. Diagnosis and return precautions discussed with patient who verbalizes understanding and is agreeable to discharge.     Portions of this note were generated with Scientist, clinical (histocompatibility and immunogenetics). Dictation errors may occur despite best attempts at proofreading.         Final Clinical Impression(s) / ED Diagnoses Final diagnoses:  Shortness of breath  Right leg pain    Rx / DC Orders ED Discharge Orders          Ordered    tiZANidine (ZANAFLEX) 2 MG tablet  Every 8 hours PRN        05/14/23 1516              Dartha Lodge, PA-C 06/02/23 1209    Arby Barrette, MD 06/02/23 [phone]

## 2023-05-14 NOTE — Progress Notes (Signed)
VASCULAR LAB   Right lower extremity venous dupled has been performed.  See CV proc for preliminary results.  Messaged negative results Jodi Geralds, PA-C  Mazi Schuff, [place], RVT 05/14/2023, 10:52 AM

## 2023-05-14 NOTE — ED Triage Notes (Addendum)
Pt to ED via POV from home with c/o right leg pain onset 2 weeks ago denies injury. Pt reported she was seen at her PCP and was prescribed prednisone but has not relieved leg pain. Pt also states she is having chest pain, SOB and left arm pain. Hx of HTN, and high cholesterol.

## 2023-05-14 NOTE — Discharge Instructions (Addendum)
Your evaluation today was very reassuring.  Use over-the-counter Salonpas lidocaine patches as needed for leg pain.  You can use prescribed muscle relaxer as needed, this medication can cause drowsiness, do not take before driving.  Follow-up closely with your primary care doctor for continued evaluation of your symptoms.  Return for new or worsening symptoms

## 2023-05-22 ENCOUNTER — Inpatient Hospital Stay: Admission: RE | Admit: 2023-05-22 | Payer: Medicare HMO | Source: Ambulatory Visit

## 2023-05-26 ENCOUNTER — Other Ambulatory Visit: Payer: Self-pay | Admitting: Family Medicine

## 2023-05-26 DIAGNOSIS — Z0000 Encounter for general adult medical examination without abnormal findings: Secondary | ICD-10-CM

## 2023-05-28 DIAGNOSIS — Z6841 Body Mass Index (BMI) 40.0 and over, adult: Secondary | ICD-10-CM | POA: Diagnosis not present

## 2023-05-28 DIAGNOSIS — M25551 Pain in right hip: Secondary | ICD-10-CM | POA: Diagnosis not present

## 2023-05-28 DIAGNOSIS — M542 Cervicalgia: Secondary | ICD-10-CM | POA: Diagnosis not present

## 2023-05-28 DIAGNOSIS — G8929 Other chronic pain: Secondary | ICD-10-CM | POA: Diagnosis not present

## 2023-05-28 DIAGNOSIS — M5441 Lumbago with sciatica, right side: Secondary | ICD-10-CM | POA: Diagnosis not present

## 2023-06-19 ENCOUNTER — Ambulatory Visit
Admission: RE | Admit: 2023-06-19 | Discharge: 2023-06-19 | Disposition: A | Discharge status: Home / Self Care | Payer: Medicare HMO | Source: Ambulatory Visit | Attending: Family Medicine | Admitting: Family Medicine

## 2023-06-19 DIAGNOSIS — E785 Hyperlipidemia, unspecified: Secondary | ICD-10-CM | POA: Diagnosis not present

## 2023-06-19 DIAGNOSIS — Z0000 Encounter for general adult medical examination without abnormal findings: Secondary | ICD-10-CM

## 2023-06-19 DIAGNOSIS — Z1231 Encounter for screening mammogram for malignant neoplasm of breast: Secondary | ICD-10-CM | POA: Diagnosis not present

## 2023-06-19 DIAGNOSIS — I700 Atherosclerosis of aorta: Secondary | ICD-10-CM | POA: Diagnosis not present

## 2023-06-19 DIAGNOSIS — Z13820 Encounter for screening for osteoporosis: Secondary | ICD-10-CM | POA: Diagnosis not present

## 2023-06-19 DIAGNOSIS — Z6841 Body Mass Index (BMI) 40.0 and over, adult: Secondary | ICD-10-CM | POA: Diagnosis not present

## 2023-06-19 DIAGNOSIS — E1165 Type 2 diabetes mellitus with hyperglycemia: Secondary | ICD-10-CM | POA: Diagnosis not present

## 2023-06-19 DIAGNOSIS — R911 Solitary pulmonary nodule: Secondary | ICD-10-CM | POA: Diagnosis not present

## 2023-06-19 DIAGNOSIS — I10 Essential (primary) hypertension: Secondary | ICD-10-CM | POA: Diagnosis not present

## 2023-06-23 ENCOUNTER — Other Ambulatory Visit: Payer: Self-pay | Admitting: Family Medicine

## 2023-06-23 DIAGNOSIS — R911 Solitary pulmonary nodule: Secondary | ICD-10-CM

## 2023-07-13 DIAGNOSIS — Z0100 Encounter for examination of eyes and vision without abnormal findings: Secondary | ICD-10-CM | POA: Diagnosis not present

## 2023-07-13 DIAGNOSIS — H524 Presbyopia: Secondary | ICD-10-CM | POA: Diagnosis not present

## 2023-07-17 ENCOUNTER — Ambulatory Visit: Payer: Self-pay

## 2023-07-17 ENCOUNTER — Ambulatory Visit (INDEPENDENT_AMBULATORY_CARE_PROVIDER_SITE_OTHER): Payer: Medicare HMO | Admitting: Physician Assistant

## 2023-07-17 ENCOUNTER — Ambulatory Visit
Admission: RE | Admit: 2023-07-17 | Discharge: 2023-07-17 | Disposition: A | Discharge status: Home / Self Care | Payer: Medicare HMO | Source: Ambulatory Visit | Attending: Family Medicine | Admitting: Family Medicine

## 2023-07-17 ENCOUNTER — Encounter: Payer: Self-pay | Admitting: Physician Assistant

## 2023-07-17 DIAGNOSIS — R911 Solitary pulmonary nodule: Secondary | ICD-10-CM

## 2023-07-17 DIAGNOSIS — M25551 Pain in right hip: Secondary | ICD-10-CM | POA: Diagnosis not present

## 2023-07-17 DIAGNOSIS — M5441 Lumbago with sciatica, right side: Secondary | ICD-10-CM

## 2023-07-17 DIAGNOSIS — R918 Other nonspecific abnormal finding of lung field: Secondary | ICD-10-CM | POA: Diagnosis not present

## 2023-07-17 MED ORDER — MELOXICAM 7.5 MG PO TABS: 7.5000 mg | ORAL_TABLET | Freq: Every day | ORAL | 1 refills | Status: AC

## 2023-07-17 NOTE — Progress Notes (Signed)
Office Visit Note   Patient: Molly Flores           Date of Birth: [birth year]           MRN: [id number] Visit Date: 07/17/2023              Requested by: Carilyn Goodpasture, NP [phone] [address] [place],  [place] [address] PCP: Adrienne Mocha, PA (Inactive)   Assessment & Plan: Visit Diagnoses: Low back pain with radiculopathy  Plan: Pleasant 67 year old woman with continuing low back pain radiating down her right leg with some associated paresthesias.  Denies any loss of bowel or bladder control.  Was helped a little bit with meloxicam.  Has now been almost a year since the symptoms started denies any left-sided symptoms.  Will renew her meloxicam today but given the length of time I think she might be a candidate for epidural steroid injections.  Will order an MRI of her lumbar spine with consideration of referral to Dr. Alvester Morin.  She did try physical therapy but did not find it helpful.  Rates her pain as moderate especially after sitting a while  Follow-Up Instructions: No follow-ups on file.   Orders:  No orders of the defined types were placed in this encounter.  No orders of the defined types were placed in this encounter.     Procedures: No procedures performed   Clinical Data: No additional findings.   Subjective: No chief complaint on file.   HPI pleasant 67 year old woman with an almost 1 year history of low back pain radiating down her right leg with some associated paresthesias.  Was helped a little bit by meloxicam but still continues to have pain which is limiting her.  Cannot walk very far.  Review of Systems  All other systems reviewed and are negative.    Objective: Vital Signs: LMP  (LMP Unknown)   Physical Exam Constitutional:      Appearance: Normal appearance.  Pulmonary:     Effort: Pulmonary effort is normal.  Skin:    General: Skin is warm and dry.  Neurological:     General: No focal deficit present.     Mental Status: She is  alert.  Psychiatric:        Mood and Affect: Mood normal.        Behavior: Behavior normal.     Ortho Exam Examination of her low back she has pain with forward flexion and extension.  Pain is focused in the lower back with radiation down the lateral side of the leg though not today but sometimes can go into her foot.  Her strength is 5 out of 5 no pain in her groin or with manipulation of her hip right now her sensation is intact compartments are soft compressible mild positive straight leg raise Specialty Comments:  No specialty comments available.  Imaging: No results found.   PMFS History: Patient Active Problem List   Diagnosis Date Noted   Syncope 10/14/2020   DOE (dyspnea on exertion) 10/14/2020   Chest pain, rule out acute myocardial infarction 10/14/2020   Pulmonary nodules 10/14/2020   Past Medical History:  Diagnosis Date   Anemia    Borderline diabetes    Carotid US    [facility] 01/2019: no ICA stenosis   Dyspnea    History of cardiac catheterization    [facility] 10/2012:  no CAD   History of nuclear stress test    [facility]oview at [facility] 01/2019: no ischemia  History of small bowel obstruction    History of TIA (transient ischemic attack)    Hyperlipidemia    Hypertension     Family History  Problem Relation Age of Onset   Diabetes Mother    Hyperlipidemia Mother    Hypertension Mother     Past Surgical History:  Procedure Laterality Date   ABDOMINAL HYSTERECTOMY     ABDOMINAL SURGERY     DIAGNOSTIC LAPAROSCOPY     LAPAROSCOPIC OOPHERECTOMY Bilateral    Social History   Occupational History   Occupation: [employer]  Tobacco Use   Smoking status: Never   Smokeless tobacco: Never  Vaping Use   Vaping status: Never Used  Substance and Sexual Activity   Alcohol use: Never   Drug use: Never   Sexual activity: Not Currently    Birth control/protection: Abstinence

## 2023-07-29 ENCOUNTER — Telehealth: Payer: Self-pay | Admitting: Physician Assistant

## 2023-07-30 ENCOUNTER — Other Ambulatory Visit: Payer: Medicare HMO

## 2023-07-31 DIAGNOSIS — Z021 Encounter for pre-employment examination: Secondary | ICD-10-CM | POA: Diagnosis not present

## 2023-08-05 DIAGNOSIS — R059 Cough, unspecified: Secondary | ICD-10-CM | POA: Diagnosis not present

## 2023-08-05 DIAGNOSIS — M7918 Myalgia, other site: Secondary | ICD-10-CM | POA: Diagnosis not present

## 2023-08-05 DIAGNOSIS — R051 Acute cough: Secondary | ICD-10-CM | POA: Diagnosis not present

## 2023-08-05 DIAGNOSIS — J069 Acute upper respiratory infection, unspecified: Secondary | ICD-10-CM | POA: Diagnosis not present

## 2023-08-05 DIAGNOSIS — R509 Fever, unspecified: Secondary | ICD-10-CM | POA: Diagnosis not present

## 2023-08-07 DIAGNOSIS — R0981 Nasal congestion: Secondary | ICD-10-CM | POA: Diagnosis not present

## 2023-08-07 DIAGNOSIS — M79604 Pain in right leg: Secondary | ICD-10-CM | POA: Diagnosis not present

## 2023-08-07 DIAGNOSIS — R051 Acute cough: Secondary | ICD-10-CM | POA: Diagnosis not present

## 2023-08-07 DIAGNOSIS — R519 Headache, unspecified: Secondary | ICD-10-CM | POA: Diagnosis not present

## 2023-08-07 DIAGNOSIS — I10 Essential (primary) hypertension: Secondary | ICD-10-CM | POA: Diagnosis not present

## 2023-08-07 DIAGNOSIS — R6883 Chills (without fever): Secondary | ICD-10-CM | POA: Diagnosis not present

## 2023-08-07 DIAGNOSIS — M79605 Pain in left leg: Secondary | ICD-10-CM | POA: Diagnosis not present

## 2023-08-07 DIAGNOSIS — R059 Cough, unspecified: Secondary | ICD-10-CM | POA: Diagnosis not present

## 2023-08-22 DIAGNOSIS — R0989 Other specified symptoms and signs involving the circulatory and respiratory systems: Secondary | ICD-10-CM | POA: Diagnosis not present

## 2023-09-10 DIAGNOSIS — R0789 Other chest pain: Secondary | ICD-10-CM | POA: Diagnosis not present

## 2023-09-10 DIAGNOSIS — I119 Hypertensive heart disease without heart failure: Secondary | ICD-10-CM | POA: Diagnosis not present

## 2023-09-10 DIAGNOSIS — E7849 Other hyperlipidemia: Secondary | ICD-10-CM | POA: Diagnosis not present

## 2023-09-10 DIAGNOSIS — R0600 Dyspnea, unspecified: Secondary | ICD-10-CM | POA: Diagnosis not present

## 2023-09-10 DIAGNOSIS — I10 Essential (primary) hypertension: Secondary | ICD-10-CM | POA: Diagnosis not present

## 2023-09-10 DIAGNOSIS — G4489 Other headache syndrome: Secondary | ICD-10-CM | POA: Diagnosis not present

## 2023-09-10 DIAGNOSIS — R9431 Abnormal electrocardiogram [ECG] [EKG]: Secondary | ICD-10-CM | POA: Diagnosis not present

## 2023-09-23 DIAGNOSIS — R52 Pain, unspecified: Secondary | ICD-10-CM | POA: Diagnosis not present

## 2023-09-23 DIAGNOSIS — J029 Acute pharyngitis, unspecified: Secondary | ICD-10-CM | POA: Diagnosis not present

## 2023-09-23 DIAGNOSIS — R059 Cough, unspecified: Secondary | ICD-10-CM | POA: Diagnosis not present

## 2023-09-23 DIAGNOSIS — J111 Influenza due to unidentified influenza virus with other respiratory manifestations: Secondary | ICD-10-CM | POA: Diagnosis not present

## 2023-09-24 DIAGNOSIS — I10 Essential (primary) hypertension: Secondary | ICD-10-CM | POA: Diagnosis not present

## 2023-09-24 DIAGNOSIS — J029 Acute pharyngitis, unspecified: Secondary | ICD-10-CM | POA: Diagnosis not present

## 2023-09-24 DIAGNOSIS — U071 COVID-19: Secondary | ICD-10-CM | POA: Diagnosis not present

## 2023-09-26 DIAGNOSIS — J029 Acute pharyngitis, unspecified: Secondary | ICD-10-CM | POA: Diagnosis not present

## 2023-09-26 DIAGNOSIS — I10 Essential (primary) hypertension: Secondary | ICD-10-CM | POA: Diagnosis not present

## 2023-10-23 DIAGNOSIS — R0789 Other chest pain: Secondary | ICD-10-CM | POA: Diagnosis not present

## 2023-12-22 ENCOUNTER — Telehealth: Payer: Self-pay

## 2023-12-22 NOTE — Telephone Encounter (Signed)
**Note De-Identified Jaydeen Odor Obfuscation** Per the [facility] Medicare website: [facility] HMO does not require prior authorization for CPT code 65784. Order forwarded to the Sleep Lab to arrange appointment for the pts PSGP.
